# Patient Record
Sex: Male | Born: 1935 | Race: Black or African American | Hispanic: No | Marital: Married | Smoking: Former smoker
Health system: Southern US, Community
[De-identification: ages and names within clinical notes are randomized; demographics above are authoritative.]

## PROBLEM LIST (undated history)

## (undated) DIAGNOSIS — G4730 Sleep apnea, unspecified: Secondary | ICD-10-CM

## (undated) DIAGNOSIS — J45909 Unspecified asthma, uncomplicated: Secondary | ICD-10-CM

## (undated) DIAGNOSIS — D649 Anemia, unspecified: Secondary | ICD-10-CM

## (undated) DIAGNOSIS — M1990 Unspecified osteoarthritis, unspecified site: Secondary | ICD-10-CM

## (undated) DIAGNOSIS — I272 Pulmonary hypertension, unspecified: Secondary | ICD-10-CM

## (undated) DIAGNOSIS — E119 Type 2 diabetes mellitus without complications: Secondary | ICD-10-CM

## (undated) DIAGNOSIS — K219 Gastro-esophageal reflux disease without esophagitis: Secondary | ICD-10-CM

## (undated) DIAGNOSIS — E7800 Pure hypercholesterolemia, unspecified: Secondary | ICD-10-CM

## (undated) DIAGNOSIS — IMO0001 Reserved for inherently not codable concepts without codable children: Secondary | ICD-10-CM

## (undated) DIAGNOSIS — R519 Headache, unspecified: Secondary | ICD-10-CM

## (undated) DIAGNOSIS — I10 Essential (primary) hypertension: Secondary | ICD-10-CM

## (undated) DIAGNOSIS — R51 Headache: Secondary | ICD-10-CM

## (undated) DIAGNOSIS — K5900 Constipation, unspecified: Secondary | ICD-10-CM

## (undated) DIAGNOSIS — N189 Chronic kidney disease, unspecified: Secondary | ICD-10-CM

## (undated) DIAGNOSIS — K759 Inflammatory liver disease, unspecified: Secondary | ICD-10-CM

## (undated) HISTORY — PX: JOINT REPLACEMENT: SHX530

## (undated) HISTORY — PX: PENILE PROSTHESIS IMPLANT: SHX240

## (undated) HISTORY — PX: COLONOSCOPY: SHX174

## (undated) HISTORY — PX: AV FISTULA PLACEMENT: SHX1204

---

## 1997-07-01 ENCOUNTER — Other Ambulatory Visit: Admission: RE | Admit: 1997-07-01 | Discharge: 1997-07-01 | Payer: Self-pay | Admitting: Family Medicine

## 1998-01-12 ENCOUNTER — Encounter: Payer: Self-pay | Admitting: Family Medicine

## 1998-01-12 ENCOUNTER — Ambulatory Visit (HOSPITAL_COMMUNITY): Admission: RE | Admit: 1998-01-12 | Discharge: 1998-01-12 | Payer: Self-pay | Admitting: Family Medicine

## 1998-12-19 ENCOUNTER — Ambulatory Visit (HOSPITAL_COMMUNITY): Admission: RE | Admit: 1998-12-19 | Discharge: 1998-12-19 | Payer: Self-pay | Admitting: Family Medicine

## 1998-12-19 ENCOUNTER — Encounter: Payer: Self-pay | Admitting: Family Medicine

## 2000-07-05 ENCOUNTER — Ambulatory Visit (HOSPITAL_COMMUNITY): Admission: RE | Admit: 2000-07-05 | Discharge: 2000-07-05 | Payer: Self-pay | Admitting: Family Medicine

## 2000-07-05 ENCOUNTER — Encounter: Payer: Self-pay | Admitting: Family Medicine

## 2000-09-17 ENCOUNTER — Encounter: Admission: RE | Admit: 2000-09-17 | Discharge: 2000-09-17 | Payer: Self-pay | Admitting: *Deleted

## 2000-09-17 ENCOUNTER — Encounter: Payer: Self-pay | Admitting: Allergy and Immunology

## 2001-02-02 ENCOUNTER — Encounter: Payer: Self-pay | Admitting: *Deleted

## 2001-02-02 ENCOUNTER — Emergency Department (HOSPITAL_COMMUNITY): Admission: EM | Admit: 2001-02-02 | Discharge: 2001-02-02 | Payer: Self-pay | Admitting: *Deleted

## 2001-11-05 ENCOUNTER — Encounter: Payer: Self-pay | Admitting: Urology

## 2001-11-05 ENCOUNTER — Encounter: Admission: RE | Admit: 2001-11-05 | Discharge: 2001-11-05 | Payer: Self-pay | Admitting: Urology

## 2002-12-11 ENCOUNTER — Ambulatory Visit (HOSPITAL_COMMUNITY): Admission: RE | Admit: 2002-12-11 | Discharge: 2002-12-11 | Payer: Self-pay | Admitting: Family Medicine

## 2002-12-11 ENCOUNTER — Encounter: Payer: Self-pay | Admitting: Family Medicine

## 2002-12-20 ENCOUNTER — Emergency Department (HOSPITAL_COMMUNITY): Admission: EM | Admit: 2002-12-20 | Discharge: 2002-12-20 | Payer: Self-pay | Admitting: Emergency Medicine

## 2003-04-19 ENCOUNTER — Emergency Department (HOSPITAL_COMMUNITY): Admission: EM | Admit: 2003-04-19 | Discharge: 2003-04-19 | Payer: Self-pay | Admitting: Emergency Medicine

## 2005-04-25 ENCOUNTER — Ambulatory Visit: Payer: Self-pay | Admitting: Physical Medicine & Rehabilitation

## 2005-04-25 ENCOUNTER — Inpatient Hospital Stay (HOSPITAL_COMMUNITY): Admission: AD | Admit: 2005-04-25 | Discharge: 2005-04-28 | Payer: Self-pay | Admitting: Orthopaedic Surgery

## 2005-09-15 ENCOUNTER — Emergency Department (HOSPITAL_COMMUNITY): Admission: EM | Admit: 2005-09-15 | Discharge: 2005-09-15 | Payer: Self-pay | Admitting: Emergency Medicine

## 2014-08-06 ENCOUNTER — Other Ambulatory Visit (HOSPITAL_COMMUNITY): Payer: Self-pay | Admitting: Gastroenterology

## 2014-08-06 DIAGNOSIS — B182 Chronic viral hepatitis C: Secondary | ICD-10-CM

## 2014-08-19 ENCOUNTER — Ambulatory Visit (HOSPITAL_COMMUNITY): Payer: Self-pay

## 2014-08-27 ENCOUNTER — Ambulatory Visit (HOSPITAL_COMMUNITY)
Admission: RE | Admit: 2014-08-27 | Discharge: 2014-08-27 | Disposition: A | Discharge status: Home / Self Care | Payer: Medicare Other | Source: Ambulatory Visit | Attending: Gastroenterology | Admitting: Gastroenterology

## 2014-08-27 DIAGNOSIS — B182 Chronic viral hepatitis C: Secondary | ICD-10-CM

## 2014-08-27 DIAGNOSIS — N281 Cyst of kidney, acquired: Secondary | ICD-10-CM | POA: Insufficient documentation

## 2014-08-27 DIAGNOSIS — I77811 Abdominal aortic ectasia: Secondary | ICD-10-CM | POA: Diagnosis not present

## 2015-03-12 ENCOUNTER — Other Ambulatory Visit: Payer: Self-pay | Admitting: Ophthalmology

## 2015-03-15 NOTE — H&P (Signed)
History & Physical:   DATE:   03-11-15 NAME:  Anthony Davis, Anthony Davis      [id number]       HISTORY OF PRESENT ILLNESS: Dialysis M/W/F/Sat     Chief Eye Complaints   Patient c/o having a cyst on LLL for about six months now. Patient c/o having problems with vision due to cataracts. Not using any gtts at this time.   HPI: EYES: Reports symptoms of problems driving & seeing at night.     LOCATION: left eye     QUALITY/COURSE:   Reports condition is worsening    INTENSITY/SEVERITY:    Reports measurement ( or degree) as severe   DURATION:   Reports the general length of symptoms to be months            ACTIVE PROBLEMS: Spastic entropion of right lower eyelid   ICD10: H02.042  ICD9:    Cysts of left lower eyelid   ICD10: H02.825  ICD9:    Age-related nuclear cataract, bilateral   ICD10: H25.13  ICD9:    Presbyopia    ICD10: H52.4  ICD9: 367.0  Onset: 03/11/2015 10:18  SURGERIES: Fistula left arm   MEDICATIONS: Multivitamin: Strength-  SIG-  Dose-  Freq-    Aspirin:  81 mg tablet  SIG-  1 each   once a day   Ciloxan (Ciprofloxacin) Solution: 0.3% solution SIG-  2 drop(s)  every 4 hours    Ilevro: 0.3% suspension SIG-  1 gtt in right eye once a day  REVIEW OF SYSTEMS: ROS:   GEN- Constitutional: HENT: GEN - Endocrine: Reports symptoms of LUNGS/Respiratory:  HEART/Cardiovascular: Reports symptoms of ABD/Gastrointestinal:  Musculoskeletal (BJE): NEURO/Neurological: PSYCH/Psychiatric:    Is the pt oriented to time, place, person? yes  Mood  normal    TOBACCO: Smoker Status:   Former Smoker. ICD10: Z87.891 ICD9: V15.82 Onset: 03/11/2015 10:22  SOCIAL HISTORY:noncontributiry  FAMILY HISTORY: Family History - 1st Degree Relatives:  Son alive and well.  ALLERGIES: Nonsteroidal Anti-Inflammatory Agents Severity-  Onset-  Reaction-  Status- Active Type- Drug allergy Date Changed-   TETRACYCLINE:  Sulfa Topicals Severity-  Onset-  Reaction-  Status- Active Type- Drug  allergy Date Changed-   Starter - Allergies - Summary:  PHYSICAL EXAMINATION: Va   03/11/2015 10:22   OD  cc 20/40 ph 20/30 OS  cc 20/100 ph 20/70  EYEGLASSES:  OD  -0.25 +0.50 x055            OS -1.25 +1.50 x116 ADD:  MR  03/11/2015 10:30  OD -0.25 +0.50 x055 20/40  OS -1.25 +1.50 x116 20/100 ADD +2.75  K's OD:45.25   axis 107     46.00   axis 017 OS 45.00  axis 029   46.75   axis 119  VF:   OD full in all four quadrants OS full in all four quadrants  Motility  full   PUPILS: 56mm reactive -mg   EYELIDS & OCULAR ADNEXA LLL cysts in nasal region with inclusion, skin tage temporal on LLL, RLL has spastic entropion,    SLE: Conjunctiva  Cornea   Decreased Tear Break-Up Time and arcus OU    anterior chamber  :  deep and quiet    Iris brown   Lens +2  nuclear  sclerosis  OD , 2+  nuclear  sclerosis  , +3 psc OS   Vitreous  CCT  Ta   in mmHg    OD  17          OS 17 Time  03/11/2015 10:32   Gonio   Dilation  phenyl 2.5% and trop 1% 03/11/2015 10:32   Fundus: difficult view   optic nerve  OD55% PINK CUP                                                                       OS 65-70% PINK CUP    Macula      OD dull light reflex                                                    OS dull light reflex   Vessels narrow arterioles   Periphery difficult view      Exam: GENERAL: Appearance: HEAD, EARS, NOSE AND THROAT: Ears-Nose (external) Inspection: Externally, nose and ears are normal in appearance and without scars, lesions, or nodules.      Hearing assessment shows no problems with normal conversation.      LUNGS and RESPIRATORY: Lung auscultation elicits no wheezing, rhonci, rales or rubs and with equal breath sounds.    Respiratory effort described as breathing is unlabored and chest movement is symmetrical.    HEART (Cardiovascular): Heart auscultation discovers regular rate and rhythm; no murmur, gallop or rub. Normal heart sounds.    ABDOMEN  (Gastrointestinal): Mass/Tenderness Exam: Neither are present.     MUSCULOSKELETAL (BJE): Inspection-Palpation: No major bone, joint, tendon, or muscle changes.      NEUROLOGICAL: Alert and oriented. No major deficits of coordination or sensation.      PSYCHIATRIC: Insight and judgment appear  both to be intact and appropriate.    Mood and affect are described as normal mood and full affect.    SKIN: Skin Inspection: No rashes or lesions  ADMITTING DIAGNOSIS: Age-related nuclear cataract, bilateral   ICD10: H25.813  ICD9:  Spastic entropion of right lower eyelid   ICD10: H02.042  ICD9:    Cysts of left lower eyelid   ICD10: H02.825  ICD9:      Presbyopia    ICD10: H52.4  ICD9: 367.0  Onset: 03/11/2015 10:18  SURGICAL TREATMENT PLAN: phaco emulsion cataract extraction  with  intraocular lens implant  OS  Risk and benefits of surgery have been reviewed with the patient and the patient agrees to proceed with the surgical procedure.  .    ___________________________ Marylynn Pearson, Brooke Bonito Starter - Inactive Problems:

## 2015-03-16 ENCOUNTER — Encounter (HOSPITAL_COMMUNITY): Payer: Self-pay | Admitting: *Deleted

## 2015-03-16 NOTE — Progress Notes (Addendum)
Pt denies cardiac history. States he does have pulmonary hypertension and is followed by his nephrologist. He is on dialysis, usually M/W/F, but now is doing 5 days a week since he was having fluid buildup. He states he's feeling much better since going to dialysis more often. Pt states he's been told he was diabetic, but is not on any medications for it.  Pre-op instructions given to pt and his wife Anthony Davis.  Pt will bring medication list tomorrow, he states it's from his PCP. That list is in Care Everywhere and that's where I got his medications from when instructing him what to take in AM.

## 2015-03-16 NOTE — Progress Notes (Signed)
Anesthesia Chart Review: SAME DAY WORK-UP.  Patient is a 80 year old male scheduled for left cataract extraction phaco and intraocular lens placement on 03/17/15 by Dr. Venetia Maxon. Anesthesia is posted for MAC.  Currently, PAT phone RN has not yet been able to reach patient to complete history.  History (as obtained by Care Everywhere records from [facility]) includes HTN, ESRD on HD (LUE AVF), DM2 (A1C 01/14/15 5.4), OSA without CPAP use, viral hepatitis C '95 s/p Zepatier X 12 weeks (started 10/12/14), left TKA '06, s/p right knee excision of benign lesion 10/27/14 ([place] with LMA at [facility]), schizophrenia, perone's disease s/p penile prosthesis and urethral stricture s/p dilation '14. Hospitalization for diverticulosis without diverticulitis 01/18/15-01/26/15. Seen in ED 03/02/15 for penile edema, abdominal pain, SOB and chest heaviness (without chest pain) X one month. EKG and troponin WNL. ED MD discussed with nephrologist Dr. Willene Hatchet who though patient likely had fluid issue and to follow-up at hemodialysis. Continued follow-up with PCP and GI also recommended. Patient was seen by urology yesterday.   - PCP is Dr. Fortunato Curling with [facility]. - Urologist is Dr. Estill Dooms with [facility].  - Hepatologist is Dr. Marty Heck with [facility]. His plan as of 01/28/15: PLAN: 1. Hepatitis A and B immune. 2. PCV and flu shots by his local physicians. 3. No indication for EGD with his platelets over 100.  4. No indication of cirrhosis, so [place] surveillance not needed. 5. Follow up in 8 weeks for [facility] visit. - Evaluated at [facility] Renal transplant clinic (initial phases)   . - Nephrologist is Dr. Willene Hatchet.  Medications listed in Care Everywhere as of 03/15/15 included albuterol, amlodipine, ASA, Symbicort, Tums, Nexium, Flovent, lactulose, lovastatin, Lopressor, Seroquel XR, Risperdal, Sensipar, clonidine-TTS, doxazosin, losartan.   03/02/15 EKG Result  Narrative ONLY (Care Everywhere; [facility]):  Normal sinus rhythm with sinus arrhythmia Normal ECG When compared with ECG of 18-Jan-2015 17:32, Premature atrial complexes are no longer Present Nonspecific T wave abnormality now evident in Anterior leads Confirmed by Aundria Rud [phone]) on 03/02/2015 6:34:08 PM  12/17/14 Echo (Care Everywhere, [facility]): Summary  Grossly normal LV function.  Mild concentric left ventricular hypertrophy  Ejection fraction is visually estimated at 50-55%  Mild mitral and tricuspid regurgitaton  Mild to moderately dilated and hypokinetic right ventricle.  Right ventricular septum flattened in systole and diastole consistent with RV  pressure and volume overload.  Severe Pulmonary hypertension, RVSP 75 mmHg   03/02/15 CXR (Care Everywhere, [facility]): IMPRESSION: 1. No acute cardiopulmonary disease. 2. Stable moderate cardiomegaly without pulmonary edema. 3. Stable chronic elevation the right hemidiaphragm with chronic scar/atelectasis in the right lower lobe. 4. Stable small chronic bilateral pleural effusions.  He is for labs on arrival.  Discussed available information with anesthesiologist Dr. Conrad Hardee. Patient had severe pulmonary HTN by 12/2014 echo. Case is posted for MAC. He is a hemodialysis patient. Further evaluation of patient and review of labs by his anesthesiologist on the day of surgery. Definitive anesthesia plan to be determined on the day of surgery.   George Hugh [facility] Short Stay Center/Anesthesiology Phone [phone] 03/16/2015 4:22 PM

## 2015-03-17 ENCOUNTER — Ambulatory Visit (HOSPITAL_COMMUNITY)
Admission: RE | Admit: 2015-03-17 | Discharge: 2015-03-17 | Disposition: A | Discharge status: Home / Self Care | Payer: Medicare Other | Source: Ambulatory Visit | Attending: Ophthalmology | Admitting: Ophthalmology

## 2015-03-17 ENCOUNTER — Other Ambulatory Visit: Payer: Self-pay

## 2015-03-17 ENCOUNTER — Ambulatory Visit (HOSPITAL_COMMUNITY): Payer: Medicare Other | Admitting: Vascular Surgery

## 2015-03-17 ENCOUNTER — Encounter (HOSPITAL_COMMUNITY)
Admission: RE | Disposition: A | Discharge status: Home / Self Care | Payer: Self-pay | Source: Ambulatory Visit | Attending: Ophthalmology

## 2015-03-17 ENCOUNTER — Encounter (HOSPITAL_COMMUNITY): Payer: Self-pay | Admitting: *Deleted

## 2015-03-17 DIAGNOSIS — R0681 Apnea, not elsewhere classified: Secondary | ICD-10-CM | POA: Insufficient documentation

## 2015-03-17 DIAGNOSIS — M1990 Unspecified osteoarthritis, unspecified site: Secondary | ICD-10-CM | POA: Insufficient documentation

## 2015-03-17 DIAGNOSIS — Z538 Procedure and treatment not carried out for other reasons: Secondary | ICD-10-CM | POA: Insufficient documentation

## 2015-03-17 DIAGNOSIS — H524 Presbyopia: Secondary | ICD-10-CM | POA: Diagnosis not present

## 2015-03-17 DIAGNOSIS — H2512 Age-related nuclear cataract, left eye: Secondary | ICD-10-CM | POA: Diagnosis present

## 2015-03-17 DIAGNOSIS — Z96652 Presence of left artificial knee joint: Secondary | ICD-10-CM | POA: Diagnosis not present

## 2015-03-17 DIAGNOSIS — H2513 Age-related nuclear cataract, bilateral: Secondary | ICD-10-CM | POA: Diagnosis not present

## 2015-03-17 DIAGNOSIS — H02825 Cysts of left lower eyelid: Secondary | ICD-10-CM | POA: Diagnosis not present

## 2015-03-17 DIAGNOSIS — N186 End stage renal disease: Secondary | ICD-10-CM | POA: Diagnosis not present

## 2015-03-17 DIAGNOSIS — I272 Other secondary pulmonary hypertension: Secondary | ICD-10-CM | POA: Diagnosis not present

## 2015-03-17 DIAGNOSIS — Z992 Dependence on renal dialysis: Secondary | ICD-10-CM | POA: Diagnosis not present

## 2015-03-17 DIAGNOSIS — Z87891 Personal history of nicotine dependence: Secondary | ICD-10-CM | POA: Insufficient documentation

## 2015-03-17 DIAGNOSIS — J90 Pleural effusion, not elsewhere classified: Secondary | ICD-10-CM | POA: Diagnosis not present

## 2015-03-17 DIAGNOSIS — I120 Hypertensive chronic kidney disease with stage 5 chronic kidney disease or end stage renal disease: Secondary | ICD-10-CM | POA: Insufficient documentation

## 2015-03-17 DIAGNOSIS — Z7982 Long term (current) use of aspirin: Secondary | ICD-10-CM | POA: Diagnosis not present

## 2015-03-17 HISTORY — PX: CATARACT EXTRACTION W/PHACO: SHX586

## 2015-03-17 HISTORY — DX: Anemia, unspecified: D64.9

## 2015-03-17 HISTORY — DX: Chronic kidney disease, unspecified: N18.9

## 2015-03-17 HISTORY — DX: Headache: R51

## 2015-03-17 HISTORY — DX: Pure hypercholesterolemia, unspecified: E78.00

## 2015-03-17 HISTORY — DX: Gastro-esophageal reflux disease without esophagitis: K21.9

## 2015-03-17 HISTORY — DX: Headache, unspecified: R51.9

## 2015-03-17 HISTORY — DX: Unspecified asthma, uncomplicated: J45.909

## 2015-03-17 HISTORY — DX: Pulmonary hypertension, unspecified: I27.20

## 2015-03-17 HISTORY — DX: Sleep apnea, unspecified: G47.30

## 2015-03-17 HISTORY — DX: Reserved for inherently not codable concepts without codable children: IMO0001

## 2015-03-17 HISTORY — DX: Constipation, unspecified: K59.00

## 2015-03-17 HISTORY — DX: Unspecified osteoarthritis, unspecified site: M19.90

## 2015-03-17 HISTORY — DX: Inflammatory liver disease, unspecified: K75.9

## 2015-03-17 HISTORY — DX: Type 2 diabetes mellitus without complications: E11.9

## 2015-03-17 HISTORY — DX: Essential (primary) hypertension: I10

## 2015-03-17 LAB — POCT I-STAT 4, (NA,K, GLUC, HGB,HCT)
Glucose, Bld: 100 mg/dL — ABNORMAL HIGH (ref 65–99)
HEMATOCRIT: 35 % — AB (ref 39.0–52.0)
Hemoglobin: 11.9 g/dL — ABNORMAL LOW (ref 13.0–17.0)
POTASSIUM: 3.9 mmol/L (ref 3.5–5.1)
SODIUM: 136 mmol/L (ref 135–145)

## 2015-03-17 LAB — GLUCOSE, CAPILLARY: GLUCOSE-CAPILLARY: 135 mg/dL — AB (ref 65–99)

## 2015-03-17 SURGERY — PHACOEMULSIFICATION, CATARACT, WITH IOL INSERTION
Anesthesia: Monitor Anesthesia Care | Laterality: Left

## 2015-03-17 MED ORDER — SODIUM CHLORIDE 0.9 % IV SOLN
10.0000 mg | INTRAVENOUS | Status: DC | PRN
  Administered 2015-03-17: 50 ug/min via INTRAVENOUS

## 2015-03-17 MED ORDER — PHENYLEPHRINE HCL 2.5 % OP SOLN
1.0000 [drp] | OPHTHALMIC | Status: AC
  Administered 2015-03-17 (×3): 1 [drp] via OPHTHALMIC
  Filled 2015-03-17: qty 2

## 2015-03-17 MED ORDER — EPHEDRINE SULFATE 50 MG/ML IJ SOLN
INTRAMUSCULAR | Status: DC | PRN
  Administered 2015-03-17: 12.5 mg via INTRAVENOUS
  Administered 2015-03-17: 25 mg via INTRAVENOUS
  Administered 2015-03-17: 12.5 mg via INTRAVENOUS

## 2015-03-17 MED ORDER — GATIFLOXACIN 0.5 % OP SOLN
1.0000 [drp] | OPHTHALMIC | Status: AC
  Administered 2015-03-17 (×3): 1 [drp] via OPHTHALMIC
  Filled 2015-03-17: qty 2.5

## 2015-03-17 MED ORDER — FENTANYL CITRATE (PF) 250 MCG/5ML IJ SOLN
INTRAMUSCULAR | Status: DC | PRN
  Administered 2015-03-17: 50 ug via INTRAVENOUS

## 2015-03-17 MED ORDER — FENTANYL CITRATE (PF) 100 MCG/2ML IJ SOLN: 25.0000 ug | INTRAMUSCULAR | Status: DC | PRN

## 2015-03-17 MED ORDER — NALOXONE HCL 0.4 MG/ML IJ SOLN
INTRAMUSCULAR | Status: DC | PRN
  Administered 2015-03-17: 200 ug via INTRAVENOUS

## 2015-03-17 MED ORDER — PROPOFOL 10 MG/ML IV BOLUS
INTRAVENOUS | Status: AC
  Filled 2015-03-17: qty 20

## 2015-03-17 MED ORDER — GLYCOPYRROLATE 0.2 MG/ML IJ SOLN
INTRAMUSCULAR | Status: AC
  Filled 2015-03-17: qty 1

## 2015-03-17 MED ORDER — VASOPRESSIN 20 UNIT/ML IV SOLN
INTRAVENOUS | Status: DC | PRN
  Administered 2015-03-17 (×2): 2 [IU] via INTRAVENOUS

## 2015-03-17 MED ORDER — MIDAZOLAM HCL 2 MG/2ML IJ SOLN
INTRAMUSCULAR | Status: AC
  Filled 2015-03-17: qty 2

## 2015-03-17 MED ORDER — FENTANYL CITRATE (PF) 250 MCG/5ML IJ SOLN
INTRAMUSCULAR | Status: AC
  Filled 2015-03-17: qty 5

## 2015-03-17 MED ORDER — TETRACAINE HCL 0.5 % OP SOLN
1.0000 [drp] | OPHTHALMIC | Status: AC
  Administered 2015-03-17: 1 [drp] via OPHTHALMIC
  Filled 2015-03-17 (×2): qty 2

## 2015-03-17 MED ORDER — PHENYLEPHRINE HCL 10 MG/ML IJ SOLN
INTRAMUSCULAR | Status: AC
  Filled 2015-03-17: qty 3

## 2015-03-17 MED ORDER — TROPICAMIDE 1 % OP SOLN
1.0000 [drp] | OPHTHALMIC | Status: AC
  Administered 2015-03-17 (×3): 1 [drp] via OPHTHALMIC
  Filled 2015-03-17: qty 3

## 2015-03-17 MED ORDER — HYALURONIDASE HUMAN 150 UNIT/ML IJ SOLN
INTRAMUSCULAR | Status: AC
  Filled 2015-03-17: qty 1

## 2015-03-17 MED ORDER — MIDAZOLAM HCL 5 MG/5ML IJ SOLN
INTRAMUSCULAR | Status: DC | PRN
  Administered 2015-03-17: 1 mg via INTRAVENOUS

## 2015-03-17 MED ORDER — CYCLOPENTOLATE HCL 1 % OP SOLN
1.0000 [drp] | OPHTHALMIC | Status: AC
  Administered 2015-03-17 (×3): 1 [drp] via OPHTHALMIC
  Filled 2015-03-17: qty 2

## 2015-03-17 MED ORDER — PHENYLEPHRINE HCL 10 MG/ML IJ SOLN
INTRAMUSCULAR | Status: DC | PRN
  Administered 2015-03-17: 160 ug via INTRAVENOUS
  Administered 2015-03-17 (×2): 80 ug via INTRAVENOUS
  Administered 2015-03-17: 160 ug via INTRAVENOUS
  Administered 2015-03-17 (×2): 80 ug via INTRAVENOUS
  Administered 2015-03-17: 120 ug via INTRAVENOUS

## 2015-03-17 MED ORDER — PROPOFOL 10 MG/ML IV BOLUS
INTRAVENOUS | Status: DC | PRN
  Administered 2015-03-17: 40 mg via INTRAVENOUS
  Administered 2015-03-17: 10 mg via INTRAVENOUS

## 2015-03-17 MED ORDER — ONDANSETRON HCL 4 MG/2ML IJ SOLN: 4.0000 mg | Freq: Once | INTRAMUSCULAR | Status: DC | PRN

## 2015-03-17 MED ORDER — GLYCOPYRROLATE 0.2 MG/ML IJ SOLN
INTRAMUSCULAR | Status: DC | PRN
  Administered 2015-03-17: 0.2 mg via INTRAVENOUS

## 2015-03-17 MED ORDER — NOREPINEPHRINE BITARTRATE 1 MG/ML IV SOLN
0.0000 ug/min | INTRAVENOUS | Status: DC
  Filled 2015-03-17: qty 4

## 2015-03-17 MED ORDER — SODIUM CHLORIDE 0.9 % IV SOLN
INTRAVENOUS | Status: DC
  Administered 2015-03-17 (×2): via INTRAVENOUS

## 2015-03-17 SURGICAL SUPPLY — 33 items
APL SRG 3 HI ABS STRL LF PLS (MISCELLANEOUS) ×1
APPLICATOR COTTON TIP 6IN STRL (MISCELLANEOUS) ×3 IMPLANT
APPLICATOR DR MATTHEWS STRL (MISCELLANEOUS) ×3 IMPLANT
BLADE KERATOME 2.75 (BLADE) ×2 IMPLANT
BLADE KERATOME 2.75MM (BLADE) ×1
CANNULA ANTERIOR CHAMBER 27GA (MISCELLANEOUS) ×3 IMPLANT
CORDS BIPOLAR (ELECTRODE) IMPLANT
COVER MAYO STAND STRL (DRAPES) ×3 IMPLANT
DRAPE OPHTHALMIC 40X48 W POUCH (DRAPES) ×3 IMPLANT
DRAPE RETRACTOR (MISCELLANEOUS) ×3 IMPLANT
GLOVE BIO SURGEON STRL SZ8 (GLOVE) ×3 IMPLANT
GOWN STRL REUS W/ TWL LRG LVL3 (GOWN DISPOSABLE) ×2 IMPLANT
GOWN STRL REUS W/TWL LRG LVL3 (GOWN DISPOSABLE) ×6
KIT BASIN OR (CUSTOM PROCEDURE TRAY) ×3 IMPLANT
KIT ROOM TURNOVER OR (KITS) ×3 IMPLANT
NDL 18GX1X1/2 (RX/OR ONLY) (NEEDLE) ×1 IMPLANT
NDL 25GX 5/8IN NON SAFETY (NEEDLE) ×1 IMPLANT
NDL FILTER BLUNT 18X1 1/2 (NEEDLE) ×1 IMPLANT
NEEDLE 18GX1X1/2 (RX/OR ONLY) (NEEDLE) ×3 IMPLANT
NEEDLE 25GX 5/8IN NON SAFETY (NEEDLE) ×3 IMPLANT
NEEDLE FILTER BLUNT 18X 1/2SAF (NEEDLE) ×2
NEEDLE FILTER BLUNT 18X1 1/2 (NEEDLE) ×1 IMPLANT
NS IRRIG 1000ML POUR BTL (IV SOLUTION) ×3 IMPLANT
PACK CATARACT CUSTOM (CUSTOM PROCEDURE TRAY) ×3 IMPLANT
PAD ARMBOARD 7.5X6 YLW CONV (MISCELLANEOUS) ×3 IMPLANT
PAK PIK CVS CATARACT (OPHTHALMIC) ×3 IMPLANT
SUT ETHILON 10 0 CS140 6 (SUTURE) IMPLANT
SUT SILK 6 0 G 6 (SUTURE) IMPLANT
SYR TB 1ML LUER SLIP (SYRINGE) ×3 IMPLANT
TIP ABS 45DEG FLARED 0.9MM (TIP) ×3 IMPLANT
TOWEL OR 17X26 10 PK STRL BLUE (TOWEL DISPOSABLE) ×3 IMPLANT
WATER STERILE IRR 1000ML POUR (IV SOLUTION) ×3 IMPLANT
WIPE INSTRUMENT VISIWIPE 73X73 (MISCELLANEOUS) ×3 IMPLANT

## 2015-03-17 NOTE — Anesthesia Postprocedure Evaluation (Signed)
Anesthesia Post Note  Patient: Anthony Davis  Procedure(s) Performed: Procedure(s) (LRB): Cancelled procedure (Left)  Patient location during evaluation: PACU Anesthesia Type: MAC Level of consciousness: awake and alert and oriented Pain management: pain level controlled Vital Signs Assessment: post-procedure vital signs reviewed and stable Respiratory status: spontaneous breathing, nonlabored ventilation and respiratory function stable Cardiovascular status: stable and blood pressure returned to baseline Anesthetic complications: yes Anesthetic complication details: anesthesia complicationsComments: Anthony Davis had significant hypotension and apnea intraop immediately after minimal doses of fentanyl and propofol, our typical doses of sedation and analgesia for patient comfort during a peribulbar block for cataract surgery. His apnea was treated with bag mask ventilation and then intubation to ensure adequate oxygenation and ventilation. The fentanyl was reversed with naloxone, a total of 2100mcg given over 3 minutes with doses of 41mcg at a time. His hypotension was treated with fluid bolus, phenylephrine, and vasopressin bolus's of 1-2 units x3 times. He did respond after ten minutes. He had bilateral, clear breath sounds immediately after intubation and no cutaneous manifestations to suggest anaphylaxis. The working diagnosis being a high spinal from the peribulbar block (unlikely given the anatomy of the block and the rapidity with which he showed clinical improvement, ~ 15 minutes) vs. Sympathetic drop from analgesia and sedation medication which lead to a drop in preload of his right heart in the setting of pulmonary hypertension which is likely more significant than was realized. He never had EKG changes during the event staying sinus rhythm from 70-80 beats/min, SaO2 remained >90 throughout. He was stable for extubation, following commands with normalized hemodynamics. CPR was not needed as he  maintained a carotid pulse throughout the procedure.  EKG in pacu is sinus rhythm with no significant findings, hemodynamics totally stable, following all commands and he is alert and oriented and completely baseline. Ultimately in review the likely diagnosis was decreased preload from the sympathectomy following fentanyl and propofol and a period of apnea that led to increased pulmonary pressures and decrease in left heart stroke volume and systemic hypotension. Anthony Davis clearly has severely limited cardiopulmonary reserve due to his systemic diseases of pulmonary hypertension and end stage renal disease requiring dialysis.     Last Vitals:  Filed Vitals:   03/17/15 1340 03/17/15 1353  BP: 142/68 132/65  Pulse: 57 56  Temp:    Resp: 16 13    Last Pain: There were no vitals filed for this visit.                 Zenaida Deed

## 2015-03-17 NOTE — Anesthesia Procedure Notes (Signed)
Procedure Name: Intubation Date/Time: 03/17/2015 12:37 PM Performed by: Scheryl Darter Pre-anesthesia Checklist: Patient identified, Emergency Drugs available, Suction available, Patient being monitored and Timeout performed Oxygen Delivery Method: Circle system utilized Preoxygenation: Pre-oxygenation with 100% oxygen Ventilation: Mask ventilation without difficulty Laryngoscope Size: Mac and 4 Grade View: Grade I Tube type: Oral Tube size: 7.5 mm Number of attempts: 1 Airway Equipment and Method: Stylet Placement Confirmation: ETT inserted through vocal cords under direct vision,  positive ETCO2 and breath sounds checked- equal and bilateral Secured at: 23 cm Tube secured with: Tape Dental Injury: Teeth and Oropharynx as per pre-operative assessment  Comments: Intubated without medication for relaxation

## 2015-03-17 NOTE — H&P (View-Only) (Signed)
History & Physical:   DATE:   03-11-15 NAME:  Anthony Davis, Anthony Davis      [id number]       HISTORY OF PRESENT ILLNESS: Dialysis M/W/F/Sat     Chief Eye Complaints   Patient c/o having a cyst on LLL for about six months now. Patient c/o having problems with vision due to cataracts. Not using any gtts at this time.   HPI: EYES: Reports symptoms of problems driving & seeing at night.     LOCATION: left eye     QUALITY/COURSE:   Reports condition is worsening    INTENSITY/SEVERITY:    Reports measurement ( or degree) as severe   DURATION:   Reports the general length of symptoms to be months            ACTIVE PROBLEMS: Spastic entropion of right lower eyelid   ICD10: H02.042  ICD9:    Cysts of left lower eyelid   ICD10: H02.825  ICD9:    Age-related nuclear cataract, bilateral   ICD10: H25.13  ICD9:    Presbyopia    ICD10: H52.4  ICD9: 367.0  Onset: 03/11/2015 10:18  SURGERIES: Fistula left arm   MEDICATIONS: Multivitamin: Strength-  SIG-  Dose-  Freq-    Aspirin:  81 mg tablet  SIG-  1 each   once a day   Ciloxan (Ciprofloxacin) Solution: 0.3% solution SIG-  2 drop(s)  every 4 hours    Ilevro: 0.3% suspension SIG-  1 gtt in right eye once a day  REVIEW OF SYSTEMS: ROS:   GEN- Constitutional: HENT: GEN - Endocrine: Reports symptoms of LUNGS/Respiratory:  HEART/Cardiovascular: Reports symptoms of ABD/Gastrointestinal:  Musculoskeletal (BJE): NEURO/Neurological: PSYCH/Psychiatric:    Is the pt oriented to time, place, person? yes  Mood  normal    TOBACCO: Smoker Status:   Former Smoker. ICD10: Z87.891 ICD9: V15.82 Onset: 03/11/2015 10:22  SOCIAL HISTORY:noncontributiry  FAMILY HISTORY: Family History - 1st Degree Relatives:  Son alive and well.  ALLERGIES: Nonsteroidal Anti-Inflammatory Agents Severity-  Onset-  Reaction-  Status- Active Type- Drug allergy Date Changed-   TETRACYCLINE:  Sulfa Topicals Severity-  Onset-  Reaction-  Status- Active Type- Drug  allergy Date Changed-   Starter - Allergies - Summary:  PHYSICAL EXAMINATION: Va   03/11/2015 10:22   OD  cc 20/40 ph 20/30 OS  cc 20/100 ph 20/70  EYEGLASSES:  OD  -0.25 +0.50 x055            OS -1.25 +1.50 x116 ADD:  MR  03/11/2015 10:30  OD -0.25 +0.50 x055 20/40  OS -1.25 +1.50 x116 20/100 ADD +2.75  K's OD:45.25   axis 107     46.00   axis 017 OS 45.00  axis 029   46.75   axis 119  VF:   OD full in all four quadrants OS full in all four quadrants  Motility  full   PUPILS: 64mm reactive -mg   EYELIDS & OCULAR ADNEXA LLL cysts in nasal region with inclusion, skin tage temporal on LLL, RLL has spastic entropion,    SLE: Conjunctiva  Cornea   Decreased Tear Break-Up Time and arcus OU    anterior chamber  :  deep and quiet    Iris brown   Lens +2  nuclear  sclerosis  OD , 2+  nuclear  sclerosis  , +3 psc OS   Vitreous  CCT  Ta   in mmHg    OD  17          OS 17 Time  03/11/2015 10:32   Gonio   Dilation  phenyl 2.5% and trop 1% 03/11/2015 10:32   Fundus: difficult view   optic nerve  OD55% PINK CUP                                                                       OS 65-70% PINK CUP    Macula      OD dull light reflex                                                    OS dull light reflex   Vessels narrow arterioles   Periphery difficult view      Exam: GENERAL: Appearance: HEAD, EARS, NOSE AND THROAT: Ears-Nose (external) Inspection: Externally, nose and ears are normal in appearance and without scars, lesions, or nodules.      Hearing assessment shows no problems with normal conversation.      LUNGS and RESPIRATORY: Lung auscultation elicits no wheezing, rhonci, rales or rubs and with equal breath sounds.    Respiratory effort described as breathing is unlabored and chest movement is symmetrical.    HEART (Cardiovascular): Heart auscultation discovers regular rate and rhythm; no murmur, gallop or rub. Normal heart sounds.    ABDOMEN  (Gastrointestinal): Mass/Tenderness Exam: Neither are present.     MUSCULOSKELETAL (BJE): Inspection-Palpation: No major bone, joint, tendon, or muscle changes.      NEUROLOGICAL: Alert and oriented. No major deficits of coordination or sensation.      PSYCHIATRIC: Insight and judgment appear  both to be intact and appropriate.    Mood and affect are described as normal mood and full affect.    SKIN: Skin Inspection: No rashes or lesions  ADMITTING DIAGNOSIS: Age-related nuclear cataract, bilateral   ICD10: H25.813  ICD9:  Spastic entropion of right lower eyelid   ICD10: H02.042  ICD9:    Cysts of left lower eyelid   ICD10: H02.825  ICD9:      Presbyopia    ICD10: H52.4  ICD9: 367.0  Onset: 03/11/2015 10:18  SURGICAL TREATMENT PLAN: phaco emulsion cataract extraction  with  intraocular lens implant  OS  Risk and benefits of surgery have been reviewed with the patient and the patient agrees to proceed with the surgical procedure.  .    ___________________________ Marylynn Pearson, Brooke Bonito Starter - Inactive Problems:

## 2015-03-17 NOTE — Op Note (Signed)
Preoperative diagnosis: Visually significant cataract left eye Postop diagnosis: Same Planned procedure: Phacoemulsification with intraocular lens implant Anesthesia: 2% Xylocaine with epinephrine in a 50-50 mixture 0.75% Marcaine with W[place]ase Procedure: The patient was transported to the operating room where he was given a peribulbar block with the aforementioned local anesthetic agent. The patient became apneic at this point in time and was unresponsive. Therefore he required endotracheal intubation and breathing assistant. After 30 minutes the patient was reaching for the endotracheal tube had adequate oxygenation he remained in sinus rhythm therefore he was extubated. However anesthesia felt due to his cardiovascular and respiratory status at this point that we should not proceed with cataract surgery at this time. The patient was transported to post anesthesia care for further observation. When he left the operating room the patient was breathing on his on and extubated. Marylynn Pearson Junior M.D.

## 2015-03-17 NOTE — Interval H&P Note (Signed)
History and Physical Interval Note:  03/17/2015 11:41 AM  Anthony Davis  has presented today for surgery, with the diagnosis of age related nuclear cataract  The various methods of treatment have been discussed with the patient and family. After consideration of risks, benefits and other options for treatment, the patient has consented to  Procedure(s): CATARACT EXTRACTION PHACO AND INTRAOCULAR LENS PLACEMENT (IOC) LEFT EYE (Left) as a surgical intervention .  The patient's history has been reviewed, patient examined, no change in status, stable for surgery.  I have reviewed the patient's chart and labs.  Questions were answered to the patient's satisfaction.     Hatcher Froning

## 2015-03-17 NOTE — Discharge Instructions (Signed)
Report of OR events today. Mr. Russey presented for left cataract with Dr. Venetia Maxon. He received a standard dose of sedation medication for the peribulbar block performed by Dr. Venetia Maxon which consisted of only 40mcg of fentanyl and a small bolus of propofol. This is our standard dosing for analgesia and sedation for patients to tolerate a peribulbar block. Mr. Deshotel immediately experienced prolonged apnea requiring bag mask support from our CRNA staff and immediately experienced significant hypotension that was unresponsive to usual vasopressor medications and a fluid challenge for over 15 minutes, ultimately needing vasopressin bolus's 1-2 units x 3 times. We quickly secured his airway with a breathing tube atraumatically to ensure oxygen delivery and adequate respirations. I ensured reversal of the fentanyl with naloxone. CPR was not needed as he maintained a carotid pulse throughout this event and his EKG remained sinus at 70-80, his SaO2 from his pulse oximeter remained >90 throughout the event. His hemodynamics stabilized and he followed commands within 15 minutes and the breathing tube was able to be removed. He was observed for over 2 hours in our post operative recovery room and was completely asymptomatic from a cardiac standpoint, followed commands immediately and was hemodynamically stable with an EKG with no significant findings. My best assessment of the situation is that Mr. Pitner has zero cardiopulmonary reserve for IV analgesia/sedation medications that we typically use for sedation at typical doses and that his pulmonary hypertension is indeed clinically significant and he can not tolerate any loss of sympathetic drive. I would have him further evaluated prior to any other procedure needing anesthesia involvement by a cardiologist. Any need for anesthesia will carry an increased risk of hemodynamic compromise given his lack of cardiopulmonary reserve.  Veatrice Kells, MD [facility] Anesthesiology

## 2015-03-17 NOTE — Transfer of Care (Addendum)
Immediate Anesthesia Transfer of Care Note  Patient: Anthony Davis  Procedure(s) Performed: Procedure(s): Cancelled procedure (Left)  Patient Location: PACU  Anesthesia Type:MAC  Level of Consciousness: awake, oriented and sedated  Airway & Oxygen Therapy: Patient Spontanous Breathing and Patient connected to face mask oxygen  Post-op Assessment: Report given to RN, Post -op Vital signs reviewed and stable and Patient moving all extremities  Post vital signs: Reviewed and stable  Last Vitals:  Filed Vitals:   03/17/15 1340 03/17/15 1353  BP: 142/68 132/65  Pulse: 57 56  Temp:    Resp: 16 13    Complications: No apparent anesthesia complications noted in PACU/ vital signs and responses as pre-op/see anesthesiologist note

## 2015-03-17 NOTE — Progress Notes (Signed)
Pt has allergy to Ibuprofen and order for  Acular noted. Dr Venetia Maxon called and informed med d/c as ordered.

## 2015-03-17 NOTE — Anesthesia Preprocedure Evaluation (Addendum)
Anesthesia Evaluation  Patient identified by MRN, date of birth, ID band Patient awake    Reviewed: Allergy & Precautions, H&P , NPO status , Patient's Chart, lab work & pertinent test results  History of Anesthesia Complications Negative for: history of anesthetic complications  Airway Mallampati: II  TM Distance: >3 FB Neck ROM: full    Dental  (+) Edentulous Upper, Edentulous Lower   Pulmonary sleep apnea , former smoker,    Pulmonary exam normal breath sounds clear to auscultation       Cardiovascular hypertension, Pt. on medications Normal cardiovascular exam Rhythm:regular Rate:Normal   12/17/14 Echo (Care Everywhere, [facility]): Summary  Grossly normal LV function.  Mild concentric left ventricular hypertrophy  Ejection fraction is visually estimated at 50-55%  Mild mitral and tricuspid regurgitaton  Mild to moderately dilated and hypokinetic right ventricle.  Right ventricular septum flattened in systole and diastole consistent with RV  pressure and volume overload.  Severe Pulmonary hypertension, RVSP 75 mmHg    Neuro/Psych negative neurological ROS     GI/Hepatic negative GI ROS, (+) Hepatitis -, C  Endo/Other  negative endocrine ROSdiabetes, Well Controlled  Renal/GU DialysisRenal disease     Musculoskeletal  (+) Arthritis ,   Abdominal   Peds  Hematology negative hematology ROS (+)   Anesthesia Other Findings   Reproductive/Obstetrics negative OB ROS                          Anesthesia Physical Anesthesia Plan  ASA: III  Anesthesia Plan: MAC   Post-op Pain Management:    Induction: Intravenous  Airway Management Planned: Nasal Cannula  Additional Equipment:   Intra-op Plan:   Post-operative Plan:   Informed Consent: I have reviewed the patients History and Physical, chart, labs and discussed the procedure including the risks, benefits and  alternatives for the proposed anesthesia with the patient or authorized representative who has indicated his/her understanding and acceptance.   Dental Advisory Given  Plan Discussed with: Anesthesiologist, CRNA and Surgeon  Anesthesia Plan Comments:        Anesthesia Quick Evaluation

## 2015-03-18 ENCOUNTER — Encounter (HOSPITAL_COMMUNITY): Payer: Self-pay | Admitting: Ophthalmology

## 2016-10-22 ENCOUNTER — Emergency Department (HOSPITAL_COMMUNITY): Payer: Medicare Other

## 2016-10-22 ENCOUNTER — Inpatient Hospital Stay (HOSPITAL_COMMUNITY)
Admission: EM | Admit: 2016-10-22 | Discharge: 2016-10-25 | DRG: 291 | Disposition: A | Discharge status: Home Health | Payer: Medicare Other | Attending: Internal Medicine | Admitting: Internal Medicine

## 2016-10-22 ENCOUNTER — Encounter (HOSPITAL_COMMUNITY): Payer: Self-pay | Admitting: *Deleted

## 2016-10-22 DIAGNOSIS — Z87891 Personal history of nicotine dependence: Secondary | ICD-10-CM

## 2016-10-22 DIAGNOSIS — I272 Pulmonary hypertension, unspecified: Secondary | ICD-10-CM | POA: Diagnosis present

## 2016-10-22 DIAGNOSIS — Z992 Dependence on renal dialysis: Secondary | ICD-10-CM

## 2016-10-22 DIAGNOSIS — Z886 Allergy status to analgesic agent status: Secondary | ICD-10-CM

## 2016-10-22 DIAGNOSIS — K5900 Constipation, unspecified: Secondary | ICD-10-CM | POA: Diagnosis present

## 2016-10-22 DIAGNOSIS — K7460 Unspecified cirrhosis of liver: Secondary | ICD-10-CM | POA: Diagnosis present

## 2016-10-22 DIAGNOSIS — E785 Hyperlipidemia, unspecified: Secondary | ICD-10-CM | POA: Diagnosis present

## 2016-10-22 DIAGNOSIS — Z882 Allergy status to sulfonamides status: Secondary | ICD-10-CM | POA: Diagnosis not present

## 2016-10-22 DIAGNOSIS — N186 End stage renal disease: Secondary | ICD-10-CM | POA: Diagnosis present

## 2016-10-22 DIAGNOSIS — D649 Anemia, unspecified: Secondary | ICD-10-CM

## 2016-10-22 DIAGNOSIS — Z79899 Other long term (current) drug therapy: Secondary | ICD-10-CM | POA: Diagnosis not present

## 2016-10-22 DIAGNOSIS — Z96652 Presence of left artificial knee joint: Secondary | ICD-10-CM | POA: Diagnosis present

## 2016-10-22 DIAGNOSIS — J189 Pneumonia, unspecified organism: Secondary | ICD-10-CM | POA: Diagnosis present

## 2016-10-22 DIAGNOSIS — Z9989 Dependence on other enabling machines and devices: Secondary | ICD-10-CM | POA: Diagnosis not present

## 2016-10-22 DIAGNOSIS — N189 Chronic kidney disease, unspecified: Secondary | ICD-10-CM | POA: Diagnosis not present

## 2016-10-22 DIAGNOSIS — B182 Chronic viral hepatitis C: Secondary | ICD-10-CM | POA: Diagnosis present

## 2016-10-22 DIAGNOSIS — K219 Gastro-esophageal reflux disease without esophagitis: Secondary | ICD-10-CM | POA: Diagnosis present

## 2016-10-22 DIAGNOSIS — D631 Anemia in chronic kidney disease: Secondary | ICD-10-CM | POA: Diagnosis present

## 2016-10-22 DIAGNOSIS — J9621 Acute and chronic respiratory failure with hypoxia: Secondary | ICD-10-CM | POA: Diagnosis present

## 2016-10-22 DIAGNOSIS — R0902 Hypoxemia: Secondary | ICD-10-CM | POA: Diagnosis present

## 2016-10-22 DIAGNOSIS — Z881 Allergy status to other antibiotic agents status: Secondary | ICD-10-CM

## 2016-10-22 DIAGNOSIS — I5033 Acute on chronic diastolic (congestive) heart failure: Secondary | ICD-10-CM | POA: Diagnosis present

## 2016-10-22 DIAGNOSIS — Y95 Nosocomial condition: Secondary | ICD-10-CM | POA: Diagnosis present

## 2016-10-22 DIAGNOSIS — E1122 Type 2 diabetes mellitus with diabetic chronic kidney disease: Secondary | ICD-10-CM | POA: Diagnosis present

## 2016-10-22 DIAGNOSIS — R109 Unspecified abdominal pain: Secondary | ICD-10-CM

## 2016-10-22 DIAGNOSIS — G4733 Obstructive sleep apnea (adult) (pediatric): Secondary | ICD-10-CM | POA: Diagnosis present

## 2016-10-22 DIAGNOSIS — Z7951 Long term (current) use of inhaled steroids: Secondary | ICD-10-CM | POA: Diagnosis not present

## 2016-10-22 DIAGNOSIS — I132 Hypertensive heart and chronic kidney disease with heart failure and with stage 5 chronic kidney disease, or end stage renal disease: Principal | ICD-10-CM | POA: Diagnosis present

## 2016-10-22 DIAGNOSIS — I509 Heart failure, unspecified: Secondary | ICD-10-CM

## 2016-10-22 DIAGNOSIS — E119 Type 2 diabetes mellitus without complications: Secondary | ICD-10-CM

## 2016-10-22 LAB — HEMOGLOBIN AND HEMATOCRIT, BLOOD
HCT: 13.9 % — ABNORMAL LOW (ref 39.0–52.0)
HEMOGLOBIN: 4.5 g/dL — AB (ref 13.0–17.0)

## 2016-10-22 LAB — CBC WITH DIFFERENTIAL/PLATELET
Basophils Absolute: 0 10*3/uL (ref 0.0–0.1)
Basophils Relative: 0 %
EOS ABS: 0.1 10*3/uL (ref 0.0–0.7)
Eosinophils Relative: 2 %
HCT: 20.8 % — ABNORMAL LOW (ref 39.0–52.0)
Hemoglobin: 6.9 g/dL — CL (ref 13.0–17.0)
LYMPHS ABS: 1 10*3/uL (ref 0.7–4.0)
Lymphocytes Relative: 15 %
MCH: 31.1 pg (ref 26.0–34.0)
MCHC: 33.2 g/dL (ref 30.0–36.0)
MCV: 93.7 fL (ref 78.0–100.0)
Monocytes Absolute: 0.6 10*3/uL (ref 0.1–1.0)
Monocytes Relative: 8 %
Neutro Abs: 5 10*3/uL (ref 1.7–7.7)
Neutrophils Relative %: 74 %
Platelets: 196 10*3/uL (ref 150–400)
RBC: 2.22 MIL/uL — ABNORMAL LOW (ref 4.22–5.81)
RDW: 15 % (ref 11.5–15.5)
WBC: 6.7 10*3/uL (ref 4.0–10.5)

## 2016-10-22 LAB — COMPREHENSIVE METABOLIC PANEL
ALBUMIN: 3.6 g/dL (ref 3.5–5.0)
ALT: 30 U/L (ref 17–63)
AST: 24 U/L (ref 15–41)
Alkaline Phosphatase: 91 U/L (ref 38–126)
Anion gap: 13 (ref 5–15)
BUN: 21 mg/dL — AB (ref 6–20)
CHLORIDE: 92 mmol/L — AB (ref 101–111)
CO2: 24 mmol/L (ref 22–32)
CREATININE: 8.58 mg/dL — AB (ref 0.61–1.24)
Calcium: 9.3 mg/dL (ref 8.9–10.3)
GFR calc Af Amer: 6 mL/min — ABNORMAL LOW (ref >60)
GFR calc non Af Amer: 5 mL/min — ABNORMAL LOW (ref >60)
GLUCOSE: 131 mg/dL — AB (ref 65–99)
Potassium: 4.3 mmol/L (ref 3.5–5.1)
SODIUM: 129 mmol/L — AB (ref 135–145)
Total Bilirubin: 0.9 mg/dL (ref 0.3–1.2)
Total Protein: 7.1 g/dL (ref 6.5–8.1)

## 2016-10-22 LAB — PREPARE RBC (CROSSMATCH)

## 2016-10-22 LAB — LIPASE, BLOOD: Lipase: 24 U/L (ref 11–51)

## 2016-10-22 LAB — TROPONIN I: TROPONIN I: 0.03 ng/mL — AB (ref <0.03)

## 2016-10-22 MED ORDER — RISPERIDONE 1 MG PO TABS
1.0000 mg | ORAL_TABLET | Freq: Every day | ORAL | Status: DC
  Administered 2016-10-23 – 2016-10-25 (×3): 1 mg via ORAL
  Filled 2016-10-22 (×3): qty 1

## 2016-10-22 MED ORDER — AMLODIPINE BESYLATE 10 MG PO TABS: 10.0000 mg | ORAL_TABLET | Freq: Every day | ORAL | Status: DC

## 2016-10-22 MED ORDER — CAMPHOR-MENTHOL 0.5-0.5 % EX LOTN
1.0000 "application " | TOPICAL_LOTION | Freq: Three times a day (TID) | CUTANEOUS | Status: DC | PRN
  Filled 2016-10-22: qty 222

## 2016-10-22 MED ORDER — HYDROMORPHONE HCL 1 MG/ML IJ SOLN
0.5000 mg | INTRAMUSCULAR | Status: DC | PRN
Start: 2016-10-22 — End: 2016-10-25
  Administered 2016-10-22 – 2016-10-23 (×3): 0.5 mg via INTRAVENOUS
  Filled 2016-10-22 (×3): qty 0.5

## 2016-10-22 MED ORDER — DEXTROSE 5 % IV SOLN: 2.0000 g | INTRAVENOUS | Status: DC

## 2016-10-22 MED ORDER — SODIUM CHLORIDE 0.9 % IV SOLN: INTRAVENOUS | Status: DC

## 2016-10-22 MED ORDER — ACETAMINOPHEN 650 MG RE SUPP: 650.0000 mg | Freq: Four times a day (QID) | RECTAL | Status: DC | PRN

## 2016-10-22 MED ORDER — SODIUM CHLORIDE 0.9 % IV SOLN
Freq: Once | INTRAVENOUS | Status: AC
  Administered 2016-10-22: 23:00:00 via INTRAVENOUS

## 2016-10-22 MED ORDER — VANCOMYCIN HCL 1000 MG IV SOLR
1500.0000 mg | Freq: Once | INTRAVENOUS | Status: AC
  Administered 2016-10-22: 1500 mg via INTRAVENOUS
  Filled 2016-10-22: qty 1500

## 2016-10-22 MED ORDER — LEVOTHYROXINE SODIUM 25 MCG PO TABS
25.0000 ug | ORAL_TABLET | Freq: Every day | ORAL | Status: DC
  Administered 2016-10-23 – 2016-10-25 (×3): 25 ug via ORAL
  Filled 2016-10-22 (×3): qty 1

## 2016-10-22 MED ORDER — IPRATROPIUM-ALBUTEROL 0.5-2.5 (3) MG/3ML IN SOLN
3.0000 mL | Freq: Three times a day (TID) | RESPIRATORY_TRACT | Status: DC
  Administered 2016-10-23: 3 mL via RESPIRATORY_TRACT
  Filled 2016-10-22 (×2): qty 3

## 2016-10-22 MED ORDER — DM-GUAIFENESIN ER 30-600 MG PO TB12
1.0000 | ORAL_TABLET | Freq: Two times a day (BID) | ORAL | Status: DC
  Administered 2016-10-23 – 2016-10-25 (×6): 1 via ORAL
  Filled 2016-10-22 (×6): qty 1

## 2016-10-22 MED ORDER — HYDROXYZINE HCL 25 MG PO TABS: 25.0000 mg | ORAL_TABLET | Freq: Three times a day (TID) | ORAL | Status: DC | PRN

## 2016-10-22 MED ORDER — ONDANSETRON HCL 4 MG/2ML IJ SOLN
4.0000 mg | Freq: Four times a day (QID) | INTRAMUSCULAR | Status: DC | PRN
  Administered 2016-10-23 (×2): 4 mg via INTRAVENOUS
  Filled 2016-10-22 (×2): qty 2

## 2016-10-22 MED ORDER — INSULIN ASPART 100 UNIT/ML ~~LOC~~ SOLN
0.0000 [IU] | Freq: Three times a day (TID) | SUBCUTANEOUS | Status: DC
  Administered 2016-10-23: 3 [IU] via SUBCUTANEOUS
  Administered 2016-10-24: 2 [IU] via SUBCUTANEOUS

## 2016-10-22 MED ORDER — DEXTROSE 5 % IV SOLN
1.0000 g | Freq: Once | INTRAVENOUS | Status: AC
  Administered 2016-10-22: 1 g via INTRAVENOUS
  Filled 2016-10-22: qty 1

## 2016-10-22 MED ORDER — NEPRO/CARBSTEADY PO LIQD: 237.0000 mL | Freq: Three times a day (TID) | ORAL | Status: DC | PRN

## 2016-10-22 MED ORDER — CARVEDILOL 12.5 MG PO TABS
12.5000 mg | ORAL_TABLET | Freq: Two times a day (BID) | ORAL | Status: DC
  Administered 2016-10-23 – 2016-10-25 (×5): 12.5 mg via ORAL
  Filled 2016-10-22 (×5): qty 1

## 2016-10-22 MED ORDER — CALCIUM CARBONATE ANTACID 1250 MG/5ML PO SUSP: 500.0000 mg | Freq: Four times a day (QID) | ORAL | Status: DC | PRN

## 2016-10-22 MED ORDER — LOSARTAN POTASSIUM 50 MG PO TABS
100.0000 mg | ORAL_TABLET | Freq: Every day | ORAL | Status: DC
  Administered 2016-10-23 – 2016-10-24 (×3): 100 mg via ORAL
  Filled 2016-10-22 (×3): qty 2

## 2016-10-22 MED ORDER — ACETAMINOPHEN 325 MG PO TABS
650.0000 mg | ORAL_TABLET | Freq: Four times a day (QID) | ORAL | Status: DC | PRN
  Administered 2016-10-24 (×2): 650 mg via ORAL
  Filled 2016-10-22 (×2): qty 2

## 2016-10-22 MED ORDER — POLYETHYLENE GLYCOL 3350 17 G PO PACK
17.0000 g | PACK | Freq: Every day | ORAL | Status: DC | PRN
  Filled 2016-10-22: qty 1

## 2016-10-22 MED ORDER — RISPERIDONE 0.5 MG PO TABS
1.5000 mg | ORAL_TABLET | Freq: Every day | ORAL | Status: DC
  Administered 2016-10-23 – 2016-10-24 (×3): 1.5 mg via ORAL
  Filled 2016-10-22 (×4): qty 1

## 2016-10-22 MED ORDER — ZOLPIDEM TARTRATE 5 MG PO TABS
5.0000 mg | ORAL_TABLET | Freq: Every evening | ORAL | Status: DC | PRN
  Administered 2016-10-23 – 2016-10-24 (×2): 5 mg via ORAL
  Filled 2016-10-22 (×2): qty 1

## 2016-10-22 MED ORDER — ONDANSETRON HCL 4 MG PO TABS
4.0000 mg | ORAL_TABLET | Freq: Four times a day (QID) | ORAL | Status: DC | PRN
  Administered 2016-10-24: 4 mg via ORAL
  Filled 2016-10-22: qty 1

## 2016-10-22 MED ORDER — SEVELAMER CARBONATE 800 MG PO TABS: 800.0000 mg | ORAL_TABLET | Freq: Two times a day (BID) | ORAL | Status: DC | PRN

## 2016-10-22 MED ORDER — IPRATROPIUM-ALBUTEROL 0.5-2.5 (3) MG/3ML IN SOLN
3.0000 mL | Freq: Four times a day (QID) | RESPIRATORY_TRACT | Status: DC
  Administered 2016-10-23: 3 mL via RESPIRATORY_TRACT

## 2016-10-22 MED ORDER — PANTOPRAZOLE SODIUM 40 MG PO TBEC
40.0000 mg | DELAYED_RELEASE_TABLET | Freq: Every day | ORAL | Status: DC
Start: 2016-10-23 — End: 2016-10-25
  Administered 2016-10-23 – 2016-10-25 (×3): 40 mg via ORAL
  Filled 2016-10-22 (×3): qty 1

## 2016-10-22 MED ORDER — SEVELAMER CARBONATE 800 MG PO TABS
800.0000 mg | ORAL_TABLET | Freq: Three times a day (TID) | ORAL | Status: DC
  Administered 2016-10-23 – 2016-10-25 (×5): 800 mg via ORAL
  Filled 2016-10-22 (×6): qty 1

## 2016-10-22 MED ORDER — PRAVASTATIN SODIUM 20 MG PO TABS
20.0000 mg | ORAL_TABLET | Freq: Every day | ORAL | Status: DC
  Administered 2016-10-23 – 2016-10-24 (×2): 20 mg via ORAL
  Filled 2016-10-22 (×2): qty 1

## 2016-10-22 MED ORDER — CALCIUM CARBONATE ANTACID 500 MG PO CHEW
2.0000 | CHEWABLE_TABLET | Freq: Three times a day (TID) | ORAL | Status: DC
  Administered 2016-10-23 – 2016-10-25 (×6): 400 mg via ORAL
  Filled 2016-10-22 (×6): qty 2

## 2016-10-22 MED ORDER — SORBITOL 70 % SOLN
30.0000 mL | Status: DC | PRN
  Administered 2016-10-23: 30 mL via ORAL
  Filled 2016-10-22 (×2): qty 30

## 2016-10-22 MED ORDER — VANCOMYCIN HCL IN DEXTROSE 1-5 GM/200ML-% IV SOLN
1000.0000 mg | INTRAVENOUS | Status: DC
  Filled 2016-10-22: qty 200

## 2016-10-22 MED ORDER — ALBUTEROL SULFATE (2.5 MG/3ML) 0.083% IN NEBU: 2.5000 mg | INHALATION_SOLUTION | RESPIRATORY_TRACT | Status: DC | PRN

## 2016-10-22 MED ORDER — CLONIDINE HCL 0.2 MG/24HR TD PTWK
0.2000 mg | MEDICATED_PATCH | TRANSDERMAL | Status: DC
  Administered 2016-10-24: 0.2 mg via TRANSDERMAL
  Filled 2016-10-22: qty 1

## 2016-10-22 MED ORDER — DOCUSATE SODIUM 283 MG RE ENEM
1.0000 | ENEMA | RECTAL | Status: DC | PRN
  Filled 2016-10-22: qty 1

## 2016-10-22 NOTE — ED Notes (Signed)
maxipime started iv

## 2016-10-22 NOTE — Progress Notes (Signed)
CRITICAL VALUE ALERT  Critical Value:  HGB 4.5  Date & Time Notied:  10/22/2016, 2240  Provider Notified: Lorin Mercy  Orders Received/Actions taken: Received orders to transfuse 2u PRBC  Eleanora Neighbor, RN

## 2016-10-22 NOTE — ED Notes (Signed)
EDP at bedside  

## 2016-10-22 NOTE — ED Notes (Signed)
EMT exposed while attempting IV access. Labs drawn accordingly per protocol and sent to lab.

## 2016-10-22 NOTE — ED Notes (Signed)
Patient was source for blood exposure to staff member, per hospital policy blood was drawn for exposure panel.

## 2016-10-22 NOTE — ED Provider Notes (Signed)
[facility] DEPT Provider Note   CSN: [id number] Arrival date & time: 10/22/16  1750     History   Chief Complaint Chief Complaint  Patient presents with  . Shortness of Breath  . Abdominal Pain  . Chest Pain    HPI Anthony Davis is a 82 y.o. male.  HPI   Pt with hx ESRD on dialysis (last dialyzed two days ago), DM, pulmonary hypertension, hepatitis, asthma, HLD, gerd p/w SOB and dry cough x 3 days.  Has associated sharp central chest pain that occurs randomly, lasts about 5 minutes at a time.  Has had subjective fevers, nasal congestion, sore throat with this.  Previously was on oxygen at home but has not been for the past 6 months.  Has an appointment with his pulmonologist in two days.   Dialysis on [address].  Denies abnormal bleeding or dark stools.   Past Medical History:  Diagnosis Date  . Anemia    low iron  . Arthritis   . Asthma   . Chronic kidney disease    dialysis M/W/F  . Constipation   . Diabetes mellitus without complication ([place])    not on any medications  . GERD (gastroesophageal reflux disease)   . Headache   . Hepatitis    Hepatitis C   . High cholesterol   . Hypertension   . Pulmonary hypertension ([place])   . Shortness of breath dyspnea   . Sleep apnea    does not use cpap    There are no active problems to display for this patient.   Past Surgical History:  Procedure Laterality Date  . AV FISTULA PLACEMENT    . CATARACT EXTRACTION W/PHACO Left 03/17/2015   Procedure: Cancelled procedure;  Surgeon: Marylynn Pearson, MD;  Location: [facility];  Service: Ophthalmology;  Laterality: Left;  . COLONOSCOPY    . JOINT REPLACEMENT Left    knee  . PENILE PROSTHESIS IMPLANT         Home Medications    Prior to Admission medications   Medication Sig Start Date End Date Taking? Authorizing Provider  albuterol (PROVENTIL HFA;VENTOLIN HFA) 108 (90 BASE) MCG/ACT inhaler Inhale into the lungs every 6 (six) hours as needed for wheezing or shortness  of breath.    [provider]  amLODipine (NORVASC) 10 MG tablet Take 10 mg by mouth daily.    [provider]  bisacodyl (DULCOLAX) 5 MG EC tablet Take 5 mg by mouth daily as needed for mild constipation or moderate constipation.    [provider]  calcium carbonate (TUMS - DOSED IN MG ELEMENTAL CALCIUM) 500 MG chewable tablet Chew 1 tablet by mouth 2 (two) times daily.    [provider]  cinacalcet (SENSIPAR) 30 MG tablet Take 30 mg by mouth daily.    [provider]  cloNIDine (CATAPRES - DOSED IN MG/24 HR) 0.2 mg/24hr patch Place 0.2 mg onto the skin once a week.    [provider]  docusate sodium (COLACE) 100 MG capsule Take 100 mg by mouth 2 (two) times daily.    [provider]  doxazosin (CARDURA) 2 MG tablet Take 2 mg by mouth daily.    [provider]  esomeprazole (NEXIUM) 40 MG capsule Take 40 mg by mouth daily at 12 noon.    [provider]  lactulose (CHRONULAC) 10 GM/15ML solution Take by mouth 3 (three) times daily.    [provider]  losartan (COZAAR) 100 MG tablet Take 100 mg by mouth  daily.    [provider]  lovastatin (MEVACOR) 20 MG tablet Take 20 mg by mouth at bedtime.    [provider]  metoprolol (LOPRESSOR) 50 MG tablet Take 50 mg by mouth 2 (two) times daily.    [provider]  polyethylene glycol (MIRALAX / GLYCOLAX) packet Take 17 g by mouth daily.    [provider]  QUEtiapine Fumarate (SEROQUEL XR) 150 MG 24 hr tablet Take 150 mg by mouth at bedtime.    [provider]  risperiDONE (RISPERDAL) 1 MG tablet Take 1 mg by mouth at bedtime.    [provider]  tamsulosin (FLOMAX) 0.4 MG CAPS capsule Take 0.4 mg by mouth daily.    [provider]    Family History No family history on file.  Social History Social History  Substance Use Topics  . Smoking status: Former Smoker    Quit date: 03/15/1985  .  Smokeless tobacco: Never Used  . Alcohol use Yes     Comment: occasional     Allergies   Ibuprofen; Sulfa antibiotics; and Tetracyclines & related   Review of Systems Review of Systems  All other systems reviewed and are negative.    Physical Exam Updated Vital Signs BP (!) 180/66 (BP Location: Right Arm)   Pulse (!) 59   Temp 98.2 F (36.8 C) (Oral)   Resp (!) 21   Ht $R'5\' 10"'qf$  (1.778 m)   Wt 82.6 kg (182 lb)   SpO2 91%   BMI 26.11 kg/m   Physical Exam  Constitutional: He appears well-developed and well-nourished. No distress.  HENT:  Head: Normocephalic and atraumatic.  Neck: Neck supple.  Cardiovascular: Normal rate and regular rhythm.   Pulmonary/Chest: Effort normal. No respiratory distress. He has decreased breath sounds in the right lower field. He has no wheezes. He has no rales.  Pt 97% on 2L [place]  Abdominal: Soft. He exhibits no distension and no mass. There is tenderness (diffuse, mild). There is no rebound and no guarding.  Neurological: He is alert. He exhibits normal muscle tone.  Skin: He is not diaphoretic.  Nursing note and vitals reviewed.    ED Treatments / Results  Labs (all labs ordered are listed, but only abnormal results are displayed) Labs Reviewed  COMPREHENSIVE METABOLIC PANEL - Abnormal; Notable for the following:       Result Value   Sodium 129 (*)    Chloride 92 (*)    Glucose, Bld 131 (*)    BUN 21 (*)    Creatinine, Ser 8.58 (*)    GFR calc non Af Amer 5 (*)    GFR calc Af Amer 6 (*)    All other components within normal limits  CBC WITH DIFFERENTIAL/PLATELET - Abnormal; Notable for the following:    RBC 2.22 (*)    Hemoglobin 6.9 (*)    HCT 20.8 (*)    All other components within normal limits  TROPONIN I - Abnormal; Notable for the following:    Troponin I 0.03 (*)    All other components within normal limits  LIPASE, BLOOD  HEMOGLOBIN AND HEMATOCRIT, BLOOD  POC OCCULT BLOOD, ED  TYPE AND SCREEN    EKG  EKG  Interpretation  Date/Time:  Sunday October 22 2016 18:19:47 EDT Ventricular Rate:  77 PR Interval:    QRS Duration: 105 QT Interval:  428 QTC Calculation: 485 R Axis:   24 Text Interpretation:  Sinus rhythm Low voltage, extremity leads Borderline prolonged QT  interval Baseline wander in lead(s) V6 No significant change since last tracing Confirmed by Alfonzo Beers [phone]) on 10/22/2016 6:21:51 PM       Radiology Dg Chest 2 View  Result Date: 10/22/2016 CLINICAL DATA:  Chest pain shortness of breath EXAM: CHEST  2 VIEW COMPARISON:  09/11/2016 FINDINGS: Elevation of the right diaphragm. Small bilateral pleural effusions. Bibasilar atelectasis or mild infiltrates. Borderline cardiomegaly with mild central congestion. No pneumothorax. IMPRESSION: 1. Small bilateral pleural effusions with bibasilar atelectasis or infiltrates 2. Borderline cardiomegaly with central vascular congestion. Electronically Signed   By: Donavan Foil M.D.   On: 10/22/2016 18:52    Procedures Procedures (including critical care time)  Medications Ordered in ED Medications  ceFEPIme (MAXIPIME) 1 g in dextrose 5 % 50 mL IVPB (not administered)  vancomycin (VANCOCIN) 1,500 mg in sodium chloride 0.9 % 250 mL IVPB (not administered)  vancomycin (VANCOCIN) IVPB 1000 mg/200 mL premix (not administered)     Initial Impression / Assessment and Plan / ED Course  I have reviewed the triage vital signs and the nursing notes.  Pertinent labs & imaging results that were available during my care of the patient were reviewed by me and considered in my medical decision making (see chart for details).     Pt with hs ESRD on dialysis, pulmonary HTN, cirrhosis, asthma, HLD, gerd p/w 3 days of dry cough, SOB, CP, subjective fever.  Hypoxic on arrival, requiring oxygen.  CXR demonstrates pleural effusions and possible infiltrates. Treated for HCAP. Pt also with Hgb 6.9, decreased significantly from 10.4 on 09/12/16 per [facility]  records. Admitted to [facility], Dr Lorin Mercy accepting.    Final Clinical Impressions(s) / ED Diagnoses   Final diagnoses:  Hypoxia  HCAP (healthcare-associated pneumonia)  Anemia, unspecified type    New Prescriptions New Prescriptions   No medications on file     Clayton Bibles, Hershal Coria 10/22/16 1956    Alfonzo Beers, MD 10/22/16 [phone]

## 2016-10-22 NOTE — Progress Notes (Signed)
Pharmacy Antibiotic Note  Anthony Davis is a 82 y.o. male admitted on 10/22/2016 with SOB x3 days.  Pharmacy has been consulted for Vancomycin dosing for pneumonia. Cefepime 1g IV x1 has been ordered per MD.   Plan: Vancomycin 1500 mg IV x1 , then 1000 mg IV qHD MWF Monitor clinical status, renal function daily and pre HD vanc levels.    Height: $Remove'5\' 10"'seFopJL$  (177.8 cm) Weight: 182 lb (82.6 kg) IBW/kg (Calculated) : 73  Temp (24hrs), Avg:98.2 F (36.8 C), Min:98.2 F (36.8 C), Max:98.2 F (36.8 C)   Recent Labs Lab 10/22/16 1827  WBC 6.7  CREATININE 8.58*    Estimated Creatinine Clearance: 7.2 mL/min (A) (by C-G formula based on SCr of 8.58 mg/dL (H)).    Allergies  Allergen Reactions  . Ibuprofen Rash  . Sulfa Antibiotics Rash  . Tetracyclines & Related Rash    Antimicrobials this admission: Vancomycin 7/22 >  Cefepime 7/22 x1 in ED  Dose adjustments this admission:   Microbiology results:  Thank you for allowing pharmacy to be a part of this patient's care. Nicole Cella, RPh Clinical Pharmacist Pager: [phone] 10/22/2016 7:24 PM

## 2016-10-22 NOTE — Progress Notes (Signed)
Pt is c/o abdominal pain at 9/10. No prn medications to give at this time. Pt is receiving blood. This nurse notified MD and awaiting response at this time. This nurse it at bedside monitoring pt.   Eleanora Neighbor, RN

## 2016-10-22 NOTE — ED Notes (Signed)
Report called to the rn on 6e

## 2016-10-22 NOTE — H&P (Addendum)
History and Physical    Calel Pisarski RDE:[id number] DOB: 05-19-1934 DOA: 10/22/2016  PCP: Joan Mayans, [facility] Consultants:  [facility] - nephrology; [facility] - pulmonology Patient coming from: Home - lives with wife; NOK: wife, [phone]  Chief Complaint: cough  HPI: Anthony Davis is a 82 y.o. male with medical history significant of OSA (recently started on CPAP), pulmonary HTN, HTN, HLD, remote Hep C, DM (diet controlled), and ESRD on MWF HD presenting with cough.  Patient has been gagging with nothing coming up for 3 days.  Abdominal pain for coughing.  Coughed yesterday all day long. +SOB. Nonproductive cough other than white sputum a couple of times.  +sick contacts at HD.  No fever.  No chills.  +abdominal pain, mid-umbilical.  He saw blood in his stools a couple of months ago, "straining and bust a vessel", bright red blood.  No dark stools now.  Last BM was about 3 days ago, which is normal for him and sometimes even longer.  +weak/tired quite a while, maybe 2-3 weeks. Has required a cane recently and had falls recently.   ED Course:  Hypoxic on arrival, required O2.  CXR with pleural effusions and possible infiltrates.  Treated for HCAP.  Also with Hgb 6.9, decreased significantly from 10.4 on 6/12 (although patient reports Hgb 7 at dialysis Friday).  Review of Systems: As per HPI; otherwise review of systems reviewed and negative.   Ambulatory Status:  Ambulates with a cane over the last few weeks  Past Medical History:  Diagnosis Date  . Anemia    low iron  . Arthritis   . Asthma   . Chronic kidney disease    dialysis M/W/F  . Constipation   . Diabetes mellitus without complication ([place])    not on any medications  . GERD (gastroesophageal reflux disease)   . Headache   . Hepatitis    Hepatitis C   . High cholesterol   . Hypertension   . Pulmonary hypertension ([place])   . Shortness of breath dyspnea   . Sleep apnea    Got CPAP about 1 week ago, setting is  five    Past Surgical History:  Procedure Laterality Date  . AV FISTULA PLACEMENT    . CATARACT EXTRACTION W/PHACO Left 03/17/2015   Procedure: Cancelled procedure;  Surgeon: Marylynn Pearson, MD;  Location: [facility];  Service: Ophthalmology;  Laterality: Left;  . COLONOSCOPY    . JOINT REPLACEMENT Left    knee  . PENILE PROSTHESIS IMPLANT      Social History   Social History  . Marital status: Married    Spouse name: N/A  . Number of children: N/A  . Years of education: N/A   Occupational History  . retired    Social History Main Topics  . Smoking status: Former Smoker    Quit date: 03/15/1985  . Smokeless tobacco: Never Used  . Alcohol use No  . Drug use: No  . Sexual activity: Not on file   Other Topics Concern  . Not on file   Social History Narrative  . No narrative on file    Allergies  Allergen Reactions  . Ace Inhibitors Other (See Comments)    Other reaction(s): ELEVATED CREATININE  . Propofol Other (See Comments)    Unknown reaction - reported by [facility] 09/11/16  . Sulfamethoxazole Hives  . Ibuprofen Rash  . Sulfa Antibiotics Rash  . Tetracycline Hives, Swelling and Rash    History reviewed. No pertinent  family history.  Prior to Admission medications   Medication Sig Start Date End Date Taking? Authorizing Provider  albuterol (PROVENTIL HFA;VENTOLIN HFA) 108 (90 BASE) MCG/ACT inhaler Inhale into the lungs every 6 (six) hours as needed for wheezing or shortness of breath.    [provider]  amLODipine (NORVASC) 10 MG tablet Take 10 mg by mouth daily.    [provider]  bisacodyl (DULCOLAX) 5 MG EC tablet Take 5 mg by mouth daily as needed for mild constipation or moderate constipation.    [provider]  calcium carbonate (TUMS - DOSED IN MG ELEMENTAL CALCIUM) 500 MG chewable tablet Chew 1 tablet by mouth 2 (two) times daily.    [provider]  cinacalcet (SENSIPAR) 30 MG tablet Take 30 mg by mouth daily.     [provider]  cloNIDine (CATAPRES - DOSED IN MG/24 HR) 0.2 mg/24hr patch Place 0.2 mg onto the skin once a week.    [provider]  docusate sodium (COLACE) 100 MG capsule Take 100 mg by mouth 2 (two) times daily.    [provider]  doxazosin (CARDURA) 2 MG tablet Take 2 mg by mouth daily.    [provider]  esomeprazole (NEXIUM) 40 MG capsule Take 40 mg by mouth daily at 12 noon.    [provider]  lactulose (CHRONULAC) 10 GM/15ML solution Take by mouth 3 (three) times daily.    [provider]  losartan (COZAAR) 100 MG tablet Take 100 mg by mouth daily.    [provider]  lovastatin (MEVACOR) 20 MG tablet Take 20 mg by mouth at bedtime.    [provider]  metoprolol (LOPRESSOR) 50 MG tablet Take 50 mg by mouth 2 (two) times daily.    [provider]  polyethylene glycol (MIRALAX / GLYCOLAX) packet Take 17 g by mouth daily.    [provider]  QUEtiapine Fumarate (SEROQUEL XR) 150 MG 24 hr tablet Take 150 mg by mouth at bedtime.    [provider]  risperiDONE (RISPERDAL) 1 MG tablet Take 1 mg by mouth at bedtime.    [provider]  tamsulosin (FLOMAX) 0.4 MG CAPS capsule Take 0.4 mg by mouth daily.    [provider]    Physical Exam: Vitals:   10/22/16 2015 10/22/16 2249 10/22/16 2335 10/23/16 0053  BP: (!) 175/63 (!) 190/87 (!) 185/78   Pulse: 67 74 67   Resp: (!) 35 20 20   Temp:  97.9 F (36.6 C) 98.2 F (36.8 C)   TempSrc:  Oral Oral   SpO2: 95% 95% 98% 97%  Weight:      Height:  $Remove'5\' 10"'MWKFrOz$  (1.778 m)       General:  Appears calm and comfortable and is NAD Eyes:  PERRL, EOMI, normal lids, iris ENT:  grossly normal hearing, lips & tongue, mmm Neck:  no LAD, masses or thyromegaly Cardiovascular:  RRR, no m/r/g. No LE edema.  Respiratory:  CTA bilaterally, no w/r/r. Normal respiratory effort. Abdomen:  soft, mildly TTP in periumbilical region, nd,  NABS Skin:  no rash or induration seen on limited exam Musculoskeletal:  grossly normal tone BUE/BLE, good ROM, no bony abnormality Psychiatric:  grossly normal mood and affect, speech fluent and appropriate, AOx3 Neurologic:  CN 2-12 grossly intact, moves all extremities in coordinated fashion, sensation intact  Labs on Admission: I have personally reviewed following labs and imaging studies  CBC:  Recent Labs Lab 10/22/16 1827 10/22/16 2210  WBC 6.7  --   NEUTROABS 5.0  --   HGB 6.9* 4.5*  HCT 20.8* 13.9*  MCV 93.7  --   PLT 196  --    Basic Metabolic Panel:  Recent Labs Lab 10/22/16 1827  NA 129*  K 4.3  CL 92*  CO2 24  GLUCOSE 131*  BUN 21*  CREATININE 8.58*  CALCIUM 9.3   GFR: Estimated Creatinine Clearance: 7.2 mL/min (A) (by C-G formula based on SCr of 8.58 mg/dL (H)). Liver Function Tests:  Recent Labs Lab 10/22/16 1827  AST 24  ALT 30  ALKPHOS 91  BILITOT 0.9  PROT 7.1  ALBUMIN 3.6    Recent Labs Lab 10/22/16 1827  LIPASE 24   No results for input(s): AMMONIA in the last 168 hours. Coagulation Profile: No results for input(s): INR, PROTIME in the last 168 hours. Cardiac Enzymes:  Recent Labs Lab 10/22/16 1827  TROPONINI 0.03*   BNP (last 3 results) No results for input(s): PROBNP in the last 8760 hours. HbA1C: No results for input(s): HGBA1C in the last 72 hours. CBG:  Recent Labs Lab 10/22/16 2242  GLUCAP 152*   Lipid Profile: No results for input(s): CHOL, HDL, LDLCALC, TRIG, CHOLHDL, LDLDIRECT in the last 72 hours. Thyroid Function Tests: No results for input(s): TSH, T4TOTAL, FREET4, T3FREE, THYROIDAB in the last 72 hours. Anemia Panel: No results for input(s): VITAMINB12, FOLATE, FERRITIN, TIBC, IRON, RETICCTPCT in the last 72 hours. Urine analysis: No results found for: COLORURINE, APPEARANCEUR, LABSPEC, PHURINE, GLUCOSEU, HGBUR, BILIRUBINUR, KETONESUR, PROTEINUR, UROBILINOGEN, NITRITE, LEUKOCYTESUR  Creatinine  Clearance: Estimated Creatinine Clearance: 7.2 mL/min (A) (by C-G formula based on SCr of 8.58 mg/dL (H)).  Sepsis Labs: $RemoveBefo'@[id number]'[id number]$ (procalcitonin:4,lacticidven:4) )No results found for this or any previous visit (from the past 240 hour(s)).   Radiological Exams on Admission: Ct Abdomen Pelvis Wo Contrast  Result Date: 10/23/2016 CLINICAL DATA:  Sudden onset of abdominal pain. Decreased hemoglobin. EXAM: CT ABDOMEN AND PELVIS WITHOUT CONTRAST TECHNIQUE: Multidetector CT imaging of the abdomen and pelvis was performed following the standard protocol without IV contrast. COMPARISON:  CT 01/16/2015 FINDINGS: Lower chest: Bilateral pleural effusions, moderate on the right and small on the left. These measure simple fluid density. Adjacent compressive atelectasis. There multi chamber cardiomegaly with mild septal thickening. Scattered basilar calcifications. Hepatobiliary: No evidence for focal lesion allowing for lack contrast. Gallbladder physiologically distended, no calcified stone. No biliary dilatation. Pancreas: No ductal dilatation or inflammation. Spleen: Normal in size without focal abnormality. Adrenals/Urinary Tract: Mild thickening on the left adrenal gland without discrete nodule. No right adrenal nodule. Bilateral renal cortical thinning. Left renal cyst. Small hyperdense cyst in the mid left kidney. Urinary bladder is nondistended. Mild bladder wall thickening. Stomach/Bowel: Stomach is physiologically distended. No small bowel dilatation or obstruction. There is mild fecalization of distal small bowel contents. Moderate to large stool in the cecum and ascending colon. Mild gaseous distention of transverse colon. Small volume of stool in the more distal colon. Descending and sigmoid colonic diverticulosis without acute inflammation. There is sigmoid colonic redundancy. Normal appendix. Vascular/Lymphatic: Aortic and branch atherosclerosis. No pathologically enlarged abdominal or pelvic lymph  nodes. Reproductive: Penile prosthesis in place. Unremarkable prostate gland. Other: No free air, free fluid, or intra-abdominal fluid collection. No evidence of retroperitoneal hemorrhage. Mild presacral edema is chronic. Musculoskeletal: There are no acute or suspicious osseous abnormalities. Bilateral intramuscular gluteal calcifications are unchanged. IMPRESSION: 1. No findings to suggest intra-abdominal hemorrhage. 2. Colonic diverticulosis without diverticulitis. Colonic redundancy with moderate proximal stool  burden, can be seen with constipation. No acute bowel inflammation. 3. Moderate right and small left pleural effusion. Cardiomegaly with septal thickening, findings consistent with CHF. 4.  Aortic Atherosclerosis (ICD10-I70.0). Electronically Signed   By: Jeb Levering M.D.   On: 10/23/2016 00:54   Dg Chest 2 View  Result Date: 10/22/2016 CLINICAL DATA:  Chest pain shortness of breath EXAM: CHEST  2 VIEW COMPARISON:  09/11/2016 FINDINGS: Elevation of the right diaphragm. Small bilateral pleural effusions. Bibasilar atelectasis or mild infiltrates. Borderline cardiomegaly with mild central congestion. No pneumothorax. IMPRESSION: 1. Small bilateral pleural effusions with bibasilar atelectasis or infiltrates 2. Borderline cardiomegaly with central vascular congestion. Electronically Signed   By: Donavan Foil M.D.   On: 10/22/2016 18:52    EKG: Independently reviewed.  NSR with rate 77; no evidence of acute ischemia; NSCSLT  Assessment/Plan Principal Problem:   Acute on chronic respiratory failure with hypoxia (HCC) Active Problems:   CHF exacerbation (HCC)   ESRD on dialysis ([place])   Diabetes mellitus type 2, diet-controlled ([place])   Anemia due to chronic kidney disease   OSA on CPAP   Acute on chronic respiratory failure -Patient with a multitude of chronic respiratory issues including pulmonary HTN, h/o tobacco dependence, and OSA on CPAP -Presenting with nonproductive cough,  SOB -Imaging appears to indicate that this is due to volume overload (see below) -He was hypoxic on arrival with O2 level of 91% on RA -Improved with [place] O2, will continue -Will continue qhs CPAP  CHF exacerbation -Echo in 6/17 showed at least mild pulmonary HTN with preserved EF 50-55% -Initial concern for PNA but CXR more consistent with pulmonary edema; suspect that volume overload is his underlying etiology for respiratory failure -Normal WBC count, no fever; will not give additional antibiotics at this time -Will check BNP - although this is unlikely to be normal based on ESRD.  Last BNP was 46k in 3/18 (this can be the basis for comparison) -Initial EKG unremarkable -Will admit with telemetry -Will request repeat echocardiogram  -Will start ASA -Will continue Cozaar - although it is not clear how efficacious this will be in light of his ESRD -Continue Coreg -Will stop Norvasc in case this is contributing to his volume overload (despite lack of LE edema) -CHF order set utilized; may need CHF team consult but will hold until Echo results are available -Lasix is likely ineffective in this dialysis patient -He appears to be stable enough at this time to wait for hemodialysis tomorrow; I have left a message on the Maintenance HD machine for now including that he appears to be volume overloaded.  If he has worsening symptoms overnight he will need more urgent evaluation. -Continue  O2 for now -Repeat EKG in AM -Will r/o with serial troponins although doubt ACS based on symptoms (suspect that troponin 0.03 is related to ESRD) -Na++ 129 is likely related to volume overload, will follow after HD  Anemia with abdominal pain -Hgb 6.9 (4.5 on H&H but this may be artificially low since the 6.9 is c/w his Friday 7 at HD); 10.4 on 6/12 -Patient with chronic anemia in the 10 range -Uncertain why he has this decrease now -He reports Hgb 7 on Friday and it was 6.9 on CBC today; repeat on H&H was  4.5 but it is my understanding that the H&H hemoglobin is often significantly different. -Regardless, he requires transfusion at this time and has been ordered to have 2 units over 4 hours each. -This is likely to  worsen his volume overload somewhat, but hopefully it will be given slowly enough to make this not a major consideration. -He complained of abdominal pain in the ER but then complained of marked worsening upon arrival on the floor. -Because of this marked worsening, a stat non-contrast CT was obtained which ruled out intraabdominal hemorrhage. -He was given low-dose Dilaudid for pain. -His pain may be related to constipation and prn medications as well as standing colace have been ordered. -Hemoccult pending. -Will recheck CBC post-transfusion to determine if additional units are needed. -If Hgb again falls, he may need GI evaluation, as well.  ESRD on HD -BUN 21/Creatinine 8.58/GFR 5; 76/11.7/5 on 6/12 -MWF HD in [facility] -Will request nephrology consult for HD here Monday AM (in a few hours)  DM -Glucose 131, 152 -Reports that he no longer has diabetes -His last A1c was 4.6 and so this appears to be the case -Will follow and treat with SSI for now      DVT prophylaxis: SCDs Code Status:  Full - confirmed with patient Family Communication: None present Disposition Plan:  Home once clinically improved Consults called: Nephrology for HD; may need cardiology and/or GI  Admission status: Admit - It is my clinical opinion that admission to [place] is reasonable and necessary because this patient will require at least 2 midnights in the hospital to treat this condition based on the medical complexity of the problems presented.  Given the aforementioned information, the predictability of an adverse outcome is felt to be significant.    Karmen Bongo MD [facility]  If 7PM-7AM, please contact night-coverage [email or web address] Password [email or web address]  10/23/2016, 1:32 AM

## 2016-10-22 NOTE — ED Triage Notes (Signed)
Patient comes in per [facility] with sob for 3 days. Diminished right lower lobe. Albuterol 5 mg given. Patient states relief after breathing tx. Denies cp. No IV access. Dialysis patient, MWF. Ems v/s 196/99, 87 HR, 99% 2L O2 acute and 94% on RA, 22 RR. Nonproductive coughing. abd pain fm coughing.

## 2016-10-22 NOTE — ED Notes (Signed)
Vancomycin added to iv.  Food given

## 2016-10-23 ENCOUNTER — Inpatient Hospital Stay (HOSPITAL_COMMUNITY): Payer: Medicare Other

## 2016-10-23 DIAGNOSIS — I509 Heart failure, unspecified: Secondary | ICD-10-CM

## 2016-10-23 DIAGNOSIS — G4733 Obstructive sleep apnea (adult) (pediatric): Secondary | ICD-10-CM

## 2016-10-23 DIAGNOSIS — E119 Type 2 diabetes mellitus without complications: Secondary | ICD-10-CM

## 2016-10-23 DIAGNOSIS — Z992 Dependence on renal dialysis: Secondary | ICD-10-CM

## 2016-10-23 DIAGNOSIS — N186 End stage renal disease: Secondary | ICD-10-CM

## 2016-10-23 DIAGNOSIS — Z9989 Dependence on other enabling machines and devices: Secondary | ICD-10-CM

## 2016-10-23 DIAGNOSIS — J9621 Acute and chronic respiratory failure with hypoxia: Secondary | ICD-10-CM | POA: Diagnosis present

## 2016-10-23 DIAGNOSIS — N189 Chronic kidney disease, unspecified: Secondary | ICD-10-CM

## 2016-10-23 DIAGNOSIS — D631 Anemia in chronic kidney disease: Secondary | ICD-10-CM | POA: Diagnosis present

## 2016-10-23 LAB — CBC
HEMATOCRIT: 26.3 % — AB (ref 39.0–52.0)
Hemoglobin: 9.1 g/dL — ABNORMAL LOW (ref 13.0–17.0)
MCH: 31.4 pg (ref 26.0–34.0)
MCHC: 34.6 g/dL (ref 30.0–36.0)
MCV: 90.7 fL (ref 78.0–100.0)
PLATELETS: 137 10*3/uL — AB (ref 150–400)
RBC: 2.9 MIL/uL — ABNORMAL LOW (ref 4.22–5.81)
RDW: 15.1 % (ref 11.5–15.5)
WBC: 5.1 10*3/uL (ref 4.0–10.5)

## 2016-10-23 LAB — RENAL FUNCTION PANEL
ALBUMIN: 3.4 g/dL — AB (ref 3.5–5.0)
Anion gap: 7 (ref 5–15)
BUN: 20 mg/dL (ref 6–20)
CHLORIDE: 92 mmol/L — AB (ref 101–111)
CO2: 27 mmol/L (ref 22–32)
CREATININE: 8.09 mg/dL — AB (ref 0.61–1.24)
Calcium: 8.8 mg/dL — ABNORMAL LOW (ref 8.9–10.3)
GFR calc Af Amer: 6 mL/min — ABNORMAL LOW (ref >60)
GFR calc non Af Amer: 6 mL/min — ABNORMAL LOW (ref >60)
GLUCOSE: 125 mg/dL — AB (ref 65–99)
PHOSPHORUS: 2.5 mg/dL (ref 2.5–4.6)
POTASSIUM: 3.8 mmol/L (ref 3.5–5.1)
Sodium: 126 mmol/L — ABNORMAL LOW (ref 135–145)

## 2016-10-23 LAB — HEMOGLOBIN AND HEMATOCRIT, BLOOD
HCT: 27.1 % — ABNORMAL LOW (ref 39.0–52.0)
Hemoglobin: 9.1 g/dL — ABNORMAL LOW (ref 13.0–17.0)

## 2016-10-23 LAB — GLUCOSE, CAPILLARY
GLUCOSE-CAPILLARY: 151 mg/dL — AB (ref 65–99)
Glucose-Capillary: 152 mg/dL — ABNORMAL HIGH (ref 65–99)
Glucose-Capillary: 102 mg/dL — ABNORMAL HIGH (ref 65–99)
Glucose-Capillary: 173 mg/dL — ABNORMAL HIGH (ref 65–99)
Glucose-Capillary: 74 mg/dL (ref 65–99)

## 2016-10-23 LAB — MRSA PCR SCREENING: MRSA by PCR: POSITIVE — AB

## 2016-10-23 MED ORDER — HYDRALAZINE HCL 20 MG/ML IJ SOLN: 5.0000 mg | INTRAMUSCULAR | Status: DC | PRN

## 2016-10-23 MED ORDER — DOXERCALCIFEROL 4 MCG/2ML IV SOLN
4.0000 ug | INTRAVENOUS | Status: DC
  Administered 2016-10-23 – 2016-10-25 (×2): 4 ug via INTRAVENOUS
  Filled 2016-10-23: qty 2

## 2016-10-23 MED ORDER — DOXERCALCIFEROL 4 MCG/2ML IV SOLN: 4.0000 ug | INTRAVENOUS | Status: DC

## 2016-10-23 MED ORDER — ASPIRIN EC 81 MG PO TBEC
81.0000 mg | DELAYED_RELEASE_TABLET | Freq: Every day | ORAL | Status: DC
  Administered 2016-10-23 – 2016-10-25 (×3): 81 mg via ORAL
  Filled 2016-10-23 (×3): qty 1

## 2016-10-23 MED ORDER — LACTULOSE 10 GM/15ML PO SOLN
20.0000 g | Freq: Once | ORAL | Status: AC
Start: 2016-10-23 — End: 2016-10-23
  Administered 2016-10-23: 20 g via ORAL
  Filled 2016-10-23: qty 30

## 2016-10-23 MED ORDER — POLYETHYLENE GLYCOL 3350 17 G PO PACK
17.0000 g | PACK | Freq: Every day | ORAL | Status: DC
  Administered 2016-10-23 – 2016-10-25 (×3): 17 g via ORAL
  Filled 2016-10-23 (×3): qty 1

## 2016-10-23 MED ORDER — SORBITOL 70 % SOLN
960.0000 mL | TOPICAL_OIL | Freq: Once | ORAL | Status: AC
  Administered 2016-10-24: 960 mL via RECTAL
  Filled 2016-10-23: qty 240

## 2016-10-23 MED ORDER — MUPIROCIN 2 % EX OINT
1.0000 "application " | TOPICAL_OINTMENT | Freq: Two times a day (BID) | CUTANEOUS | Status: DC
  Administered 2016-10-23 – 2016-10-25 (×5): 1 via NASAL
  Filled 2016-10-23 (×2): qty 22

## 2016-10-23 MED ORDER — DOXERCALCIFEROL 4 MCG/2ML IV SOLN
INTRAVENOUS | Status: AC
  Filled 2016-10-23: qty 2

## 2016-10-23 MED ORDER — HYDROCOD POLST-CPM POLST ER 10-8 MG/5ML PO SUER
5.0000 mL | Freq: Two times a day (BID) | ORAL | Status: DC | PRN
  Administered 2016-10-23 – 2016-10-24 (×4): 5 mL via ORAL
  Filled 2016-10-23 (×4): qty 5

## 2016-10-23 MED ORDER — CHLORHEXIDINE GLUCONATE CLOTH 2 % EX PADS
6.0000 | MEDICATED_PAD | Freq: Every day | CUTANEOUS | Status: DC
  Administered 2016-10-23 – 2016-10-25 (×2): 6 via TOPICAL

## 2016-10-23 NOTE — Consult Note (Signed)
Referring Provider: No ref. provider found Primary Care Physician:  Glendon Axe, MD Primary Nephrologist:    Reason for Consultation:   Medical management of End Stage Renal Disease   HPI:81 y.o. male with medical history significant of OSA (recently started on CPAP), pulmonary HTN, HTN, HLD, remote Hep C, DM (diet controlled), and ESRD on MWF  Appears to have Right sided pneumonia and consolidation. CXF showed pleural effusions bilaterally and infiltrates  Hemoglobin low at 6.9 ( 7 last week)    Past Medical History:  Diagnosis Date  . Anemia    low iron  . Arthritis   . Asthma   . Chronic kidney disease    dialysis M/W/F  . Constipation   . Diabetes mellitus without complication ([place])    not on any medications  . GERD (gastroesophageal reflux disease)   . Headache   . Hepatitis    Hepatitis C   . High cholesterol   . Hypertension   . Pulmonary hypertension ([place])   . Shortness of breath dyspnea   . Sleep apnea    Got CPAP about 1 week ago, setting is five    Past Surgical History:  Procedure Laterality Date  . AV FISTULA PLACEMENT    . CATARACT EXTRACTION W/PHACO Left 03/17/2015   Procedure: Cancelled procedure;  Surgeon: Marylynn Pearson, MD;  Location: [facility];  Service: Ophthalmology;  Laterality: Left;  . COLONOSCOPY    . JOINT REPLACEMENT Left    knee  . PENILE PROSTHESIS IMPLANT      Prior to Admission medications   Medication Sig Start Date End Date Taking? Authorizing Provider  amLODipine (NORVASC) 10 MG tablet Take 10 mg by mouth daily.   Yes [provider]  calcium carbonate (TUMS EX) 750 MG chewable tablet Chew 1 tablet by mouth 3 (three) times daily.   Yes [provider]  carvedilol (COREG) 12.5 MG tablet Take 12.5 mg by mouth 2 (two) times daily.  07/30/16  Yes [provider]  cloNIDine (CATAPRES - DOSED IN MG/24 HR) 0.2 mg/24hr patch Place 0.2 mg onto the skin every Tuesday.    Yes [provider]  esomeprazole  (NEXIUM) 40 MG capsule Take 40 mg by mouth 2 (two) times daily before a meal.    Yes [provider]  levothyroxine (SYNTHROID, LEVOTHROID) 25 MCG tablet Take 25 mcg by mouth daily before breakfast. 08/27/16  Yes [provider]  losartan (COZAAR) 100 MG tablet Take 100 mg by mouth at bedtime.    Yes [provider]  lovastatin (MEVACOR) 20 MG tablet Take 20 mg by mouth daily with supper.    Yes [provider]  polyethylene glycol (MIRALAX / GLYCOLAX) packet Take 17 g by mouth daily as needed (constipation). Mix in 8 oz liquid and drink   Yes [provider]  risperiDONE (RISPERDAL) 0.5 MG tablet Take 1.5 mg by mouth at bedtime.   Yes [provider]  risperiDONE (RISPERDAL) 1 MG tablet Take 1 mg by mouth daily before breakfast.    Yes [provider]  sevelamer carbonate (RENVELA) 800 MG tablet Take 800 mg by mouth See admin instructions. Take 1 tablet (800 mg) by mouth three times daily with meals and 1 tablet (800 mg)  with snacks - up to 5 tablets daily   Yes [provider]    Current Facility-Administered Medications  Medication Dose Route Frequency Provider Last Rate Last Dose  . acetaminophen (TYLENOL) tablet 650 mg  650 mg Oral Q6H  PRN Karmen Bongo, MD       Or  . acetaminophen (TYLENOL) suppository 650 mg  650 mg Rectal Q6H PRN Karmen Bongo, MD      . albuterol (PROVENTIL) (2.5 MG/3ML) 0.083% nebulizer solution 2.5 mg  2.5 mg Nebulization Q2H PRN Karmen Bongo, MD      . aspirin EC tablet 81 mg  81 mg Oral Daily Karmen Bongo, MD   81 mg at 10/23/16 0908  . calcium carbonate (dosed in mg elemental calcium) suspension 500 mg of elemental calcium  500 mg of elemental calcium Oral Q6H PRN Karmen Bongo, MD      . calcium carbonate (TUMS - dosed in mg elemental calcium) chewable tablet 400 mg of elemental calcium  2 tablet Oral TID WC Karmen Bongo, MD   400 mg of elemental calcium at 10/23/16 0811  .  camphor-menthol (SARNA) lotion 1 application  1 application Topical Z6X PRN Karmen Bongo, MD       And  . hydrOXYzine (ATARAX/VISTARIL) tablet 25 mg  25 mg Oral Q8H PRN Karmen Bongo, MD      . carvedilol (COREG) tablet 12.5 mg  12.5 mg Oral BID WC Karmen Bongo, MD   12.5 mg at 10/23/16 0811  . Chlorhexidine Gluconate Cloth 2 % PADS 6 each  6 each Topical Q0600 Karmen Bongo, MD   6 each at 10/23/16 0631  . chlorpheniramine-HYDROcodone (TUSSIONEX) 10-8 MG/5ML suspension 5 mL  5 mL Oral Q12H PRN Schorr, Rhetta Mura, NP   5 mL at 10/23/16 0225  . [START ON 10/24/2016] cloNIDine (CATAPRES - Dosed in mg/24 hr) patch 0.2 mg  0.2 mg Transdermal Q Alverda Skeans, MD      . dextromethorphan-guaiFENesin [facility] DM) 30-600 MG per 12 hr tablet 1 tablet  1 tablet Oral BID Karmen Bongo, MD   1 tablet at 10/23/16 0908  . docusate sodium (ENEMEEZ) enema 283 mg  1 enema Rectal PRN Karmen Bongo, MD      . feeding supplement (NEPRO CARB STEADY) liquid 237 mL  237 mL Oral TID PRN Karmen Bongo, MD      . hydrALAZINE (APRESOLINE) injection 5 mg  5 mg Intravenous Q4H PRN Karmen Bongo, MD      . HYDROmorphone (DILAUDID) injection 0.5 mg  0.5 mg Intravenous Q4H PRN Karmen Bongo, MD   0.5 mg at 10/23/16 0960  . insulin aspart (novoLOG) injection 0-15 Units  0-15 Units Subcutaneous TID WC Karmen Bongo, MD      . levothyroxine (SYNTHROID, LEVOTHROID) tablet 25 mcg  25 mcg Oral QAC breakfast Karmen Bongo, MD   25 mcg at 10/23/16 [phone]  . losartan (COZAAR) tablet 100 mg  100 mg Oral QHS Karmen Bongo, MD   100 mg at 10/23/16 0001  . mupirocin ointment (BACTROBAN) 2 % 1 application  1 application Nasal BID Karmen Bongo, MD   1 application at 98/11/91 [phone]  . ondansetron (ZOFRAN) tablet 4 mg  4 mg Oral Q6H PRN Karmen Bongo, MD       Or  . ondansetron [facility]) injection 4 mg  4 mg Intravenous Q6H PRN Karmen Bongo, MD   4 mg at 10/23/16 1000  . pantoprazole (PROTONIX) EC tablet 40 mg   40 mg Oral Daily Karmen Bongo, MD   40 mg at 10/23/16 0908  . polyethylene glycol (MIRALAX / GLYCOLAX) packet 17 g  17 g Oral Daily PRN Karmen Bongo, MD      . polyethylene glycol (MIRALAX / GLYCOLAX) packet 17 g  17 g Oral Daily Karmen Bongo, MD   17 g at 10/23/16 2458  . pravastatin (PRAVACHOL) tablet 20 mg  20 mg Oral q1800 Karmen Bongo, MD      . risperiDONE (RISPERDAL) tablet 1 mg  1 mg Oral QAC breakfast Karmen Bongo, MD   1 mg at 10/23/16 0998  . risperiDONE (RISPERDAL) tablet 1.5 mg  1.5 mg Oral QHS Karmen Bongo, MD   1.5 mg at 10/23/16 0002  . sevelamer carbonate (RENVELA) tablet 800 mg  800 mg Oral TID WC Karmen Bongo, MD   800 mg at 10/23/16 0811  . sevelamer carbonate (RENVELA) tablet 800 mg  800 mg Oral BID PRN Karmen Bongo, MD      . sorbitol 70 % solution 30 mL  30 mL Oral PRN Karmen Bongo, MD      . zolpidem Lorrin Mais) tablet 5 mg  5 mg Oral QHS PRN Karmen Bongo, MD        Allergies as of 10/22/2016 - Review Complete 10/22/2016  Allergen Reaction Noted  . Ace inhibitors Other (See Comments) 08/11/2013  . Propofol Other (See Comments) 09/11/2016  . Sulfamethoxazole Hives 03/13/2015  . Ibuprofen Rash 03/16/2015  . Sulfa antibiotics Rash 03/16/2015  . Tetracycline Hives, Swelling, and Rash 03/13/2015    History reviewed. No pertinent family history.  Social History   Social History  . Marital status: Married    Spouse name: N/A  . Number of children: N/A  . Years of education: N/A   Occupational History  . retired    Social History Main Topics  . Smoking status: Former Smoker    Quit date: 03/15/1985  . Smokeless tobacco: Never Used  . Alcohol use No  . Drug use: No  . Sexual activity: Not on file   Other Topics Concern  . Not on file   Social History Narrative  . No narrative on file    Review of Systems: Gen: Denies any fever, chills, sweats, anorexia, fatigue, weakness, malaise, weight loss, and sleep disorder HEENT: No  visual complaints, No history of Retinopathy. Normal external appearance No Epistaxis or Sore throat. No sinusitis.  History of Retinopathy CV: Denies chest pain, angina, palpitations, syncope, orthopnea, PND, peripheral edema, and claudication. Resp: ++  dyspnea at rest,++ dyspnea with exercise, ++ cough,++ sputum, wheezing, no coughing up blood, and pleurisy. GI: Denies vomiting blood, jaundice, and fecal incontinence.   Denies dysphagia or odynophagia. GU : Denies urinary burning, blood in urine, urinary frequency, urinary hesitancy, nocturnal urination, and urinary incontinence.  No renal calculi. MS: Denies joint pain, limitation of movement, and swelling, stiffness, low back pain, extremity pain. Denies muscle weakness, cramps, atrophy.  No use of non steroidal antiinflammatory drugs. Derm: Denies rash, itching, dry skin, hives, moles, warts, or unhealing ulcers.  Psych: Denies depression, anxiety, memory loss, suicidal ideation, hallucinations, paranoia, and confusion. Heme: Denies bruising, bleeding, and enlarged lymph nodes. Neuro: No headache.  No diplopia. No dysarthria.  No dysphasia.  No history of CVA.  No Seizures. No paresthesias.  No weakness. Endocrine   DM.  No Thyroid disease.  No Adrenal disease.  Physical Exam: Vital signs in last 24 hours: Temp:  [97.8 F (36.6 C)-98.2 F (36.8 C)] 97.9 F (36.6 C) (07/23 0715) Pulse Rate:  [59-74] 72 (07/23 0715) Resp:  [13-35] 18 (07/23 0715) BP: (163-190)/(63-90) 190/90 (07/23 0715) SpO2:  [91 %-100 %] 100 % (07/23 0741) Weight:  [182 lb (82.6 kg)] 182 lb (82.6 kg) (07/22 1756) Last BM Date:  10/19/16 General:   Alert,  Well-developed, well-nourished, pleasant and cooperative in NAD Head:  Normocephalic and atraumatic. Eyes:  Sclera clear, no icterus.   Conjunctiva pink. Ears:  Normal auditory acuity. Nose:  No deformity, discharge,  or lesions. Mouth:  No deformity or lesions, dentition normal. Neck:  Supple; no masses or  thyromegaly. JVP not elevated Lungs:  Diminished breath sounds at bases . Heart:  Regular rate and rhythm; no murmurs, clicks, rubs,  or gallops. Abdomen:  Soft, nontender and nondistended. No masses, hepatosplenomegaly or hernias noted. Normal bowel sounds, without guarding, and without rebound.   Msk:  Symmetrical without gross deformities. Normal posture. Pulses:  No carotid, renal, femoral bruits. DP and PT symmetrical and equal Extremities:  Without clubbing or edema. AVF Left with Thrill  Neurologic:  Alert and  oriented x4;  grossly normal neurologically. Skin:  Intact without significant lesions or rashes. Cervical Nodes:  No significant cervical adenopathy. Psych:  Alert and cooperative. Normal mood and affect.  Intake/Output from previous day: 07/22 0701 - 07/23 0700 In: 575 [P.O.:240; Blood:335] Out: 0  Intake/Output this shift: Total I/O In: 882 [P.O.:420; Blood:462] Out: -   Lab Results:  Recent Labs  10/22/16 1827 10/22/16 2210 10/23/16 0747  WBC 6.7  --   --   HGB 6.9* 4.5* 9.1*  HCT 20.8* 13.9* 27.1*  PLT 196  --   --    BMET  Recent Labs  10/22/16 1827  NA 129*  K 4.3  CL 92*  CO2 24  GLUCOSE 131*  BUN 21*  CREATININE 8.58*  CALCIUM 9.3   LFT  Recent Labs  10/22/16 1827  PROT 7.1  ALBUMIN 3.6  AST 24  ALT 30  ALKPHOS 91  BILITOT 0.9   PT/INR No results for input(s): LABPROT, INR in the last 72 hours. Hepatitis Panel No results for input(s): HEPBSAG, HCVAB, HEPAIGM, HEPBIGM in the last 72 hours.  Studies/Results: Ct Abdomen Pelvis Wo Contrast  Result Date: 10/23/2016 CLINICAL DATA:  Sudden onset of abdominal pain. Decreased hemoglobin. EXAM: CT ABDOMEN AND PELVIS WITHOUT CONTRAST TECHNIQUE: Multidetector CT imaging of the abdomen and pelvis was performed following the standard protocol without IV contrast. COMPARISON:  CT 01/16/2015 FINDINGS: Lower chest: Bilateral pleural effusions, moderate on the right and small on the left. These  measure simple fluid density. Adjacent compressive atelectasis. There multi chamber cardiomegaly with mild septal thickening. Scattered basilar calcifications. Hepatobiliary: No evidence for focal lesion allowing for lack contrast. Gallbladder physiologically distended, no calcified stone. No biliary dilatation. Pancreas: No ductal dilatation or inflammation. Spleen: Normal in size without focal abnormality. Adrenals/Urinary Tract: Mild thickening on the left adrenal gland without discrete nodule. No right adrenal nodule. Bilateral renal cortical thinning. Left renal cyst. Small hyperdense cyst in the mid left kidney. Urinary bladder is nondistended. Mild bladder wall thickening. Stomach/Bowel: Stomach is physiologically distended. No small bowel dilatation or obstruction. There is mild fecalization of distal small bowel contents. Moderate to large stool in the cecum and ascending colon. Mild gaseous distention of transverse colon. Small volume of stool in the more distal colon. Descending and sigmoid colonic diverticulosis without acute inflammation. There is sigmoid colonic redundancy. Normal appendix. Vascular/Lymphatic: Aortic and branch atherosclerosis. No pathologically enlarged abdominal or pelvic lymph nodes. Reproductive: Penile prosthesis in place. Unremarkable prostate gland. Other: No free air, free fluid, or intra-abdominal fluid collection. No evidence of retroperitoneal hemorrhage. Mild presacral edema is chronic. Musculoskeletal: There are no acute or suspicious osseous abnormalities. Bilateral intramuscular gluteal calcifications are  unchanged. IMPRESSION: 1. No findings to suggest intra-abdominal hemorrhage. 2. Colonic diverticulosis without diverticulitis. Colonic redundancy with moderate proximal stool burden, can be seen with constipation. No acute bowel inflammation. 3. Moderate right and small left pleural effusion. Cardiomegaly with septal thickening, findings consistent with CHF. 4.  Aortic  Atherosclerosis (ICD10-I70.0). Electronically Signed   By: Jeb Levering M.D.   On: 10/23/2016 00:54   Dg Chest 2 View  Result Date: 10/22/2016 CLINICAL DATA:  Chest pain shortness of breath EXAM: CHEST  2 VIEW COMPARISON:  09/11/2016 FINDINGS: Elevation of the right diaphragm. Small bilateral pleural effusions. Bibasilar atelectasis or mild infiltrates. Borderline cardiomegaly with mild central congestion. No pneumothorax. IMPRESSION: 1. Small bilateral pleural effusions with bibasilar atelectasis or infiltrates 2. Borderline cardiomegaly with central vascular congestion. Electronically Signed   By: Donavan Foil M.D.   On: 10/22/2016 18:52    Assessment/Plan:  ESRD-MWF dialysis [place]  Will continue regimen today  ANEMIA- Will continue to follow  It appears that the patient will need transfusion and ESA administration   MBD- Will follow  -- binders  Vitamin D and Calcimimetics as needed  HTN/VOL- Consistent with marked volume overload continue to challenge EDW   ACCESS- AVF     LOS: 1 [place] W $RemoveBefor'@[email or web address]'[email or web address]$ '@12'$ :14 PM

## 2016-10-23 NOTE — Progress Notes (Signed)
New Admission Note: Late Entry  Arrival Method: By bed from ED, around 2250 Mental Orientation: Alert and oriented Telemetry: [address], CCMD notified Assessment: Completed Skin: Completed, refer to flowsheets IV: Right hand Pain: Denies Tubes: None Safety Measures: Safety Fall Prevention Plan was given, discussed and signed. Admission: Completed [address] Orientation: Patient has been orientated to the room, unit and the staff. Family: None  Orders have been reviewed and implemented. Will continue to monitor the patient. Call light has been placed within reach and bed alarm has been activated.   Perry Mount, RN  Phone Number: [phone]

## 2016-10-23 NOTE — Progress Notes (Signed)
PROGRESS NOTE    Anthony Davis  DQE:[id number] DOB: [birth year] DOA: 10/22/2016 PCP: Caffie Damme, MD    Brief Narrative:  82 y.o. male with medical history significant of OSA (recently started on CPAP), pulmonary HTN, HTN, HLD, remote Hep C, DM (diet controlled), and ESRD on MWF HD presenting with cough.  Patient has been gagging with nothing coming up for 3 days.  Abdominal pain for coughing.  Coughed yesterday all day long. +SOB. Nonproductive cough other than white sputum a couple of times.  +sick contacts at HD.  No fever.  No chills.  +abdominal pain, mid-umbilical.  He saw blood in his stools a couple of months ago, "straining and bust a vessel", bright red blood.  No dark stools now.  Last BM was about 3 days ago, which is normal for him and sometimes even longer.  +weak/tired quite a while, maybe 2-3 weeks. Has required a cane recently and had falls recently  Assessment & Plan:   Principal Problem:   Acute on chronic respiratory failure with hypoxia (HCC) Active Problems:   CHF exacerbation (HCC)   ESRD on dialysis (HCC)   Diabetes mellitus type 2, diet-controlled (HCC)   Anemia due to chronic kidney disease   OSA on CPAP  Acute on chronic respiratory failure -Patient with a multitude of chronic respiratory issues including pulmonary HTN, h/o tobacco dependence, and OSA on CPAP -Presenting with nonproductive cough, SOB -Imaging appears to indicate that this is due to volume overload (see below) -He was hypoxic on arrival with O2 level of 91% on RA -Patient had undergone HD today  Anemia with abdominal pain -Hgb 6.9  -Given 2 units PRBC's overnight with post-transfustion hgb of 9.1 -No obvious signs of acute blood loss -Will repeat CBC, continue to transfuse as needed -Patient does have long hx of significant constipation with large stool burden seen on CT per my own read. Will anticipate underlying hemorrhoids, thus likely heme pos stools  ESRD on HD -BUN 21/Creatinine  8.58/GFR 5; 76/11.7/5 on 6/12 -MWF HD in [facility] -Nephrology consulted. Patient underwent HD on 7/23  DM -Glucose 131, 152 -Reports that he no longer has diabetes -His last A1c was 4.6 -Cont on SSI coverage  Constipation -Large stool burden noted on imaging -Will give cathartics until resolution of constipation  DVT prophylaxis: SCD's Code Status: Full Family Communication: Pt in room, family not at bedside Disposition Plan: Possible   Consultants:   Nephrology  Procedures:     Antimicrobials: Anti-infectives    Start     Dose/Rate Route Frequency Ordered Stop   10/23/16 2000  ceFEPIme (MAXIPIME) 2 g in dextrose 5 % 50 mL IVPB  Status:  Discontinued     2 g 100 mL/hr over 30 Minutes Intravenous Every M-W-F (2000) 10/22/16 2237 10/23/16 0130   10/23/16 1200  vancomycin (VANCOCIN) IVPB 1000 mg/200 mL premix  Status:  Discontinued     1,000 mg 200 mL/hr over 60 Minutes Intravenous Every M-W-F (Hemodialysis) 10/22/16 1934 10/23/16 0130   10/22/16 2015  vancomycin (VANCOCIN) 1,500 mg in sodium chloride 0.9 % 250 mL IVPB     1,500 mg 250 mL/hr over 60 Minutes Intravenous  Once 10/22/16 1934 10/22/16 2211   10/22/16 1930  ceFEPIme (MAXIPIME) 1 g in dextrose 5 % 50 mL IVPB     1 g 100 mL/hr over 30 Minutes Intravenous  Once 10/22/16 1923 10/22/16 2100       Subjective: Complains of constipation  Objective: Vitals:   10/23/16 1430  10/23/16 1500 10/23/16 1626 10/23/16 1705  BP: (!) 174/86 (!) 165/76 (!) 171/83 (!) 171/71  Pulse: 76 76 72 74  Resp: $Remo'15 13 19 20  'EXpIC$ Temp:   97.6 F (36.4 C) 97.9 F (36.6 C)  TempSrc:   Oral Oral  SpO2: 98% 98% 98% 100%  Weight:   85.6 kg (188 lb 11.4 oz)   Height:        Intake/Output Summary (Last 24 hours) at 10/23/16 1805 Last data filed at 10/23/16 1727  Gross per 24 hour  Intake             1697 ml  Output             4850 ml  Net            -3153 ml   Filed Weights   10/22/16 1756 10/23/16 1217 10/23/16 1626  Weight:  82.6 kg (182 lb) 89.6 kg (197 lb 8.5 oz) 85.6 kg (188 lb 11.4 oz)    Examination: General exam: Appears calm and comfortable  Respiratory system: Clear to auscultation. Respiratory effort normal. Cardiovascular system: S1 & S2 heard, RRR.  Gastrointestinal system: Abdomen mildly distended, decreased BS Central nervous system: Alert and oriented. No focal neurological deficits. Extremities: Symmetric 5 x 5 power. Skin: No rashes, lesions  Psychiatry: Judgement and insight appear normal. Mood & affect appropriate.   Data Reviewed: I have personally reviewed following labs and imaging studies  CBC:  Recent Labs Lab 10/22/16 1827 10/22/16 2210 10/23/16 0747 10/23/16 1240  WBC 6.7  --   --  5.1  NEUTROABS 5.0  --   --   --   HGB 6.9* 4.5* 9.1* 9.1*  HCT 20.8* 13.9* 27.1* 26.3*  MCV 93.7  --   --  90.7  PLT 196  --   --  314*   Basic Metabolic Panel:  Recent Labs Lab 10/22/16 1827 10/23/16 1240  NA 129* 126*  K 4.3 3.8  CL 92* 92*  CO2 24 27  GLUCOSE 131* 125*  BUN 21* 20  CREATININE 8.58* 8.09*  CALCIUM 9.3 8.8*  PHOS  --  2.5   GFR: Estimated Creatinine Clearance: 7.6 mL/min (A) (by C-G formula based on SCr of 8.09 mg/dL (H)). Liver Function Tests:  Recent Labs Lab 10/22/16 1827 10/23/16 1240  AST 24  --   ALT 30  --   ALKPHOS 91  --   BILITOT 0.9  --   PROT 7.1  --   ALBUMIN 3.6 3.4*    Recent Labs Lab 10/22/16 1827  LIPASE 24   No results for input(s): AMMONIA in the last 168 hours. Coagulation Profile: No results for input(s): INR, PROTIME in the last 168 hours. Cardiac Enzymes:  Recent Labs Lab 10/22/16 1827  TROPONINI 0.03*   BNP (last 3 results) No results for input(s): PROBNP in the last 8760 hours. HbA1C: No results for input(s): HGBA1C in the last 72 hours. CBG:  Recent Labs Lab 10/22/16 2242 10/23/16 0754 10/23/16 1147 10/23/16 1708  GLUCAP 152* 102* 151* 173*   Lipid Profile: No results for input(s): CHOL, HDL,  LDLCALC, TRIG, CHOLHDL, LDLDIRECT in the last 72 hours. Thyroid Function Tests: No results for input(s): TSH, T4TOTAL, FREET4, T3FREE, THYROIDAB in the last 72 hours. Anemia Panel: No results for input(s): VITAMINB12, FOLATE, FERRITIN, TIBC, IRON, RETICCTPCT in the last 72 hours. Sepsis Labs: No results for input(s): PROCALCITON, LATICACIDVEN in the last 168 hours.  Recent Results (from the past 240 hour(s))  Culture,  blood (routine x 2) Call MD if unable to obtain prior to antibiotics being given     Status: None (Preliminary result)   Collection Time: 10/22/16 10:15 PM  Result Value Ref Range Status   Specimen Description BLOOD RIGHT FOOT  Final   Special Requests   Final    BOTTLES DRAWN AEROBIC AND ANAEROBIC Blood Culture results may not be optimal due to an inadequate volume of blood received in culture bottles   Culture NO GROWTH < 24 HOURS  Final   Report Status PENDING  Incomplete  MRSA PCR Screening     Status: Abnormal   Collection Time: 10/23/16 12:07 AM  Result Value Ref Range Status   MRSA by PCR POSITIVE (A) NEGATIVE Final    Comment:        The GeneXpert MRSA Assay (FDA approved for NASAL specimens only), is one component of a comprehensive MRSA colonization surveillance program. It is not intended to diagnose MRSA infection nor to guide or monitor treatment for MRSA infections. RESULT CALLED TO, READ BACK BY AND VERIFIED WITH: RN Signe Colt [phone] $RemoveBeforeD'@0415'[email or web address]$  [facility]      Radiology Studies: Ct Abdomen Pelvis Wo Contrast  Result Date: 10/23/2016 CLINICAL DATA:  Sudden onset of abdominal pain. Decreased hemoglobin. EXAM: CT ABDOMEN AND PELVIS WITHOUT CONTRAST TECHNIQUE: Multidetector CT imaging of the abdomen and pelvis was performed following the standard protocol without IV contrast. COMPARISON:  CT 01/16/2015 FINDINGS: Lower chest: Bilateral pleural effusions, moderate on the right and small on the left. These measure simple fluid density. Adjacent compressive  atelectasis. There multi chamber cardiomegaly with mild septal thickening. Scattered basilar calcifications. Hepatobiliary: No evidence for focal lesion allowing for lack contrast. Gallbladder physiologically distended, no calcified stone. No biliary dilatation. Pancreas: No ductal dilatation or inflammation. Spleen: Normal in size without focal abnormality. Adrenals/Urinary Tract: Mild thickening on the left adrenal gland without discrete nodule. No right adrenal nodule. Bilateral renal cortical thinning. Left renal cyst. Small hyperdense cyst in the mid left kidney. Urinary bladder is nondistended. Mild bladder wall thickening. Stomach/Bowel: Stomach is physiologically distended. No small bowel dilatation or obstruction. There is mild fecalization of distal small bowel contents. Moderate to large stool in the cecum and ascending colon. Mild gaseous distention of transverse colon. Small volume of stool in the more distal colon. Descending and sigmoid colonic diverticulosis without acute inflammation. There is sigmoid colonic redundancy. Normal appendix. Vascular/Lymphatic: Aortic and branch atherosclerosis. No pathologically enlarged abdominal or pelvic lymph nodes. Reproductive: Penile prosthesis in place. Unremarkable prostate gland. Other: No free air, free fluid, or intra-abdominal fluid collection. No evidence of retroperitoneal hemorrhage. Mild presacral edema is chronic. Musculoskeletal: There are no acute or suspicious osseous abnormalities. Bilateral intramuscular gluteal calcifications are unchanged. IMPRESSION: 1. No findings to suggest intra-abdominal hemorrhage. 2. Colonic diverticulosis without diverticulitis. Colonic redundancy with moderate proximal stool burden, can be seen with constipation. No acute bowel inflammation. 3. Moderate right and small left pleural effusion. Cardiomegaly with septal thickening, findings consistent with CHF. 4.  Aortic Atherosclerosis (ICD10-I70.0). Electronically  Signed   By: Jeb Levering M.D.   On: 10/23/2016 00:54   Dg Chest 2 View  Result Date: 10/22/2016 CLINICAL DATA:  Chest pain shortness of breath EXAM: CHEST  2 VIEW COMPARISON:  09/11/2016 FINDINGS: Elevation of the right diaphragm. Small bilateral pleural effusions. Bibasilar atelectasis or mild infiltrates. Borderline cardiomegaly with mild central congestion. No pneumothorax. IMPRESSION: 1. Small bilateral pleural effusions with bibasilar atelectasis or infiltrates 2. Borderline cardiomegaly with central vascular congestion. Electronically  Signed   By: Donavan Foil M.D.   On: 10/22/2016 18:52    Scheduled Meds: . aspirin EC  81 mg Oral Daily  . calcium carbonate  2 tablet Oral TID WC  . carvedilol  12.5 mg Oral BID WC  . Chlorhexidine Gluconate Cloth  6 each Topical Q0600  . [START ON 10/24/2016] cloNIDine  0.2 mg Transdermal Q Tue  . dextromethorphan-guaiFENesin  1 tablet Oral BID  . doxercalciferol  4 mcg Intravenous Q M,W,F-HD  . insulin aspart  0-15 Units Subcutaneous TID WC  . levothyroxine  25 mcg Oral QAC breakfast  . losartan  100 mg Oral QHS  . mupirocin ointment  1 application Nasal BID  . pantoprazole  40 mg Oral Daily  . polyethylene glycol  17 g Oral Daily  . pravastatin  20 mg Oral q1800  . risperiDONE  1 mg Oral QAC breakfast  . risperiDONE  1.5 mg Oral QHS  . sevelamer carbonate  800 mg Oral TID WC   Continuous Infusions:   LOS: 1 day   Marin Wisner, Orpah Melter, MD [facility] Pager [phone]  If 7PM-7AM, please contact night-coverage [email or web address] Password [facility] 10/23/2016, 6:05 PM

## 2016-10-24 LAB — CBC
HEMATOCRIT: 28.5 % — AB (ref 39.0–52.0)
Hemoglobin: 9.5 g/dL — ABNORMAL LOW (ref 13.0–17.0)
MCH: 31.1 pg (ref 26.0–34.0)
MCHC: 33.3 g/dL (ref 30.0–36.0)
MCV: 93.4 fL (ref 78.0–100.0)
Platelets: 162 10*3/uL (ref 150–400)
RBC: 3.05 MIL/uL — ABNORMAL LOW (ref 4.22–5.81)
RDW: 15.7 % — AB (ref 11.5–15.5)
WBC: 5.8 10*3/uL (ref 4.0–10.5)

## 2016-10-24 LAB — BPAM RBC
BLOOD PRODUCT EXPIRATION DATE: 201808212359
Blood Product Expiration Date: 201808212359
ISSUE DATE / TIME: 201807222301
ISSUE DATE / TIME: 201807230300
UNIT TYPE AND RH: 5100
UNIT TYPE AND RH: 5100

## 2016-10-24 LAB — TYPE AND SCREEN
ABO/RH(D): O POS
ANTIBODY SCREEN: NEGATIVE
UNIT DIVISION: 0
UNIT DIVISION: 0

## 2016-10-24 LAB — GLUCOSE, CAPILLARY
GLUCOSE-CAPILLARY: 127 mg/dL — AB (ref 65–99)
Glucose-Capillary: 114 mg/dL — ABNORMAL HIGH (ref 65–99)
Glucose-Capillary: 72 mg/dL (ref 65–99)
Glucose-Capillary: 101 mg/dL — ABNORMAL HIGH (ref 65–99)

## 2016-10-24 LAB — HEMOGLOBIN A1C
Hgb A1c MFr Bld: 4.9 % (ref 4.8–5.6)
Mean Plasma Glucose: 94 mg/dL

## 2016-10-24 LAB — HEPATITIS B SURFACE ANTIGEN: HEP B S AG: NEGATIVE

## 2016-10-24 MED ORDER — ORAL CARE MOUTH RINSE: 15.0000 mL | Freq: Two times a day (BID) | OROMUCOSAL | Status: DC

## 2016-10-24 MED ORDER — SORBITOL 70 % SOLN
960.0000 mL | TOPICAL_OIL | Freq: Once | ORAL | Status: AC
  Administered 2016-10-24: 960 mL via RECTAL
  Filled 2016-10-24: qty 240

## 2016-10-24 NOTE — Progress Notes (Signed)
Patient has home CPAP and will place self on when ready. RT will continue to monitor.

## 2016-10-24 NOTE — Evaluation (Signed)
Physical Therapy Evaluation Patient Details Name: Anthony Davis MRN: [id number] DOB: 1935-01-20 Today's Date: 10/24/2016   History of Present Illness  82 yo male with onset of HCAP and acute on chronic resp failure was admitted, had GI bleed and transfused.  PMHx:  HTN, DM, Hep C, OSA, HD, pulm HTN,   Clinical Impression  Pt was up to walk with a drop in O2 sats on 2L O2.  His starting O2 sat was 97% and after walk was 93%, pulses stable in 73 to 76b/min after activity.   Has been on chronic O2 use in ALF and will likely need to use more often.  He is also in need of RW with a HHPT consult requested to monitor his vitals and train strength and endurance.   Will follow acutely as needed for same.    Follow Up Recommendations Home health PT;Supervision for mobility/OOB    Equipment Recommendations  Rolling walker with 5" wheels    Recommendations for Other Services       Precautions / Restrictions Precautions Precautions: Fall (telemetry) Restrictions Weight Bearing Restrictions: No      Mobility  Bed Mobility Overal bed mobility: Modified Independent                Transfers Overall transfer level: Needs assistance Equipment used: Rolling walker (2 wheeled);1 person hand held assist Transfers: Sit to/from Stand Sit to Stand: Min guard         General transfer comment: reminders for hand placement and for reaching to sit back down, stood impulsively at the bed  Ambulation/Gait Ambulation/Gait assistance: Min guard;Min assist Ambulation Distance (Feet): 30 Feet Assistive device: Rolling walker (2 wheeled);1 person hand held assist Gait Pattern/deviations: Step-through pattern;Step-to pattern;Trunk flexed;Wide base of support;Shuffle;Decreased stride length Gait velocity: reduced Gait velocity interpretation: Below normal speed for age/gender General Gait Details: tends to need extra time for turns and minor help to turn and navigate confined spaces  Stairs             Wheelchair Mobility    Modified Rankin (Stroke Patients Only)       Balance Overall balance assessment: Needs assistance Sitting-balance support: Feet supported Sitting balance-Leahy Scale: Good     Standing balance support: Bilateral upper extremity supported Standing balance-Leahy Scale: Fair Standing balance comment: less than fair balance dynamically                             Pertinent Vitals/Pain Pain Assessment: No/denies pain    Home Living Family/patient expects to be discharged to:: Assisted living Living Arrangements: Spouse/significant other             Home Equipment: Cane - single point;Shower seat - built in;Grab bars - toilet;Grab bars - tub/shower      Prior Function Level of Independence: Needs assistance   Gait / Transfers Assistance Needed: SPC for gait in facility  ADL's / Homemaking Assistance Needed: ALF staff for assisting with housework and cooking,         Hand Dominance   Dominant Hand: Right    Extremity/Trunk Assessment   Upper Extremity Assessment Upper Extremity Assessment: Overall WFL for tasks assessed    Lower Extremity Assessment Lower Extremity Assessment: Generalized weakness    Cervical / Trunk Assessment Cervical / Trunk Assessment: Normal  Communication   Communication: HOH  Cognition Arousal/Alertness: Lethargic;Awake/alert Behavior During Therapy: WFL for tasks assessed/performed Overall Cognitive Status: No family/caregiver present to determine baseline cognitive functioning  General Comments: repetitive information from pt, HOH and asking questions repetitively      General Comments      Exercises     Assessment/Plan    PT Assessment Patient needs continued PT services  PT Problem List Decreased strength;Decreased range of motion;Decreased activity tolerance;Decreased balance;Decreased mobility;Decreased coordination;Decreased  cognition;Decreased knowledge of use of DME;Decreased safety awareness;Cardiopulmonary status limiting activity       PT Treatment Interventions DME instruction;Gait training;Functional mobility training;Therapeutic activities;Therapeutic exercise;Balance training;Neuromuscular re-education;Cognitive remediation;Patient/family education    PT Goals (Current goals can be found in the Care Plan section)  Acute Rehab PT Goals Patient Stated Goal: to avoid overdoing activity PT Goal Formulation: With patient Time For Goal Achievement: 11/07/16 Potential to Achieve Goals: Good    Frequency Min 3X/week   Barriers to discharge Other (comment) (ALF may not be able to provide hands on help)      Co-evaluation               AM-PAC PT "6 Clicks" Daily Activity  Outcome Measure Difficulty turning over in bed (including adjusting bedclothes, sheets and blankets)?: A Little Difficulty moving from lying on back to sitting on the side of the bed? : A Little Difficulty sitting down on and standing up from a chair with arms (e.g., wheelchair, bedside commode, etc,.)?: Total Help needed moving to and from a bed to chair (including a wheelchair)?: A Little Help needed walking in hospital room?: A Little Help needed climbing 3-5 steps with a railing? : A Lot 6 Click Score: 15    End of Session Equipment Utilized During Treatment: Gait belt;Oxygen Activity Tolerance: Patient limited by fatigue Patient left: in bed;with call bell/phone within reach;with nursing/sitter in room Nurse Communication: Mobility status PT Visit Diagnosis: Unsteadiness on feet (R26.81);Muscle weakness (generalized) (M62.81);History of falling (Z91.81);Difficulty in walking, not elsewhere classified (R26.2)    Time: 1015-1029 (1056 to 1124) PT Time Calculation (min) (ACUTE ONLY): 14 min   Charges:   PT Evaluation $PT Eval Low Complexity: 1 Procedure PT Treatments $Gait Training: 8-22 mins $Therapeutic Exercise:  8-22 mins   PT G Codes:   PT G-Codes **NOT FOR INPATIENT CLASS** Functional Assessment Tool Used: AM-PAC 6 Clicks Basic Mobility    Ramond Dial 10/24/2016, 11:46 AM   Mee Hives, PT MS Acute Rehab Dept. Number: [place]

## 2016-10-24 NOTE — Progress Notes (Signed)
Patient has home CPAP and can place on when ready. RT will continue to monitor.

## 2016-10-24 NOTE — Progress Notes (Addendum)
PROGRESS NOTE    Kristen Bushway  LXB:[id number] DOB: 27-Jul-1934 DOA: 10/22/2016 PCP: Caffie Damme, MD    Brief Narrative:  82 y.o. male with medical history significant of OSA (recently started on CPAP), pulmonary HTN, HTN, HLD, remote Hep C, DM (diet controlled), and ESRD on MWF HD presenting with cough.  Patient has been gagging with nothing coming up for 3 days.  Abdominal pain for coughing.  Coughed yesterday all day long. +SOB. Nonproductive cough other than white sputum a couple of times.  +sick contacts at HD.  No fever.  No chills.  +abdominal pain, mid-umbilical.  He saw blood in his stools a couple of months ago, "straining and bust a vessel", bright red blood.  No dark stools now.  Last BM was about 3 days ago, which is normal for him and sometimes even longer.  +weak/tired quite a while, maybe 2-3 weeks. Has required a cane recently and had falls recently  Assessment & Plan:   Principal Problem:   Acute on chronic respiratory failure with hypoxia (HCC) Active Problems:   CHF exacerbation (HCC)   ESRD on dialysis (HCC)   Diabetes mellitus type 2, diet-controlled (HCC)   Anemia due to chronic kidney disease   OSA on CPAP  Acute on chronic respiratory failure -Patient with a multitude of chronic respiratory issues including pulmonary HTN, h/o tobacco dependence, and OSA on CPAP -Presenting with nonproductive cough, SOB -Imaging appears to indicate that this is due to volume overload (see below) -He was hypoxic on arrival with O2 level of 91% on RA -overall stable at present. Discussed with Dr. Hyman Hopes. Plan to cont to address volume overload, plan HD tomorrow, per below  Anemia with abdominal pain -Hgb 6.9  -Given 2 units PRBC's overnight with post-transfustion hgb of 9.1 -No obvious signs of acute blood loss -Will repeat CBC, continue to transfuse as needed -Patient does have long hx of significant constipation with large stool burden seen on CT per my own read. Will  anticipate underlying hemorrhoids, thus likely heme pos stools  -hgb stable, now rising  ESRD on HD -BUN 21/Creatinine 8.58/GFR 5; 76/11.7/5 on 6/12 -MWF HD in [facility] -Nephrology consulted. Patient underwent HD on 7/23 -Per Nephrology, still clinically volume overloaded, plan HD tomorrow  DM -Glucose 131, 152 -Reports that he no longer has diabetes -His last A1c was 4.6 -Cont on SSI coverage -Stable at present  Constipation -Large stool burden noted on imaging -results with multiple enemas/cathartics -continue to promote bowel movement  Acute on chronic diastolic chf  DVT prophylaxis: SCD's Code Status: Full Family Communication: Pt in room, family not at bedside Disposition Plan: Possible d/c in 24hrs  Consultants:   Nephrology  Procedures:     Antimicrobials: Anti-infectives    Start     Dose/Rate Route Frequency Ordered Stop   10/23/16 2000  ceFEPIme (MAXIPIME) 2 g in dextrose 5 % 50 mL IVPB  Status:  Discontinued     2 g 100 mL/hr over 30 Minutes Intravenous Every M-W-F (2000) 10/22/16 2237 10/23/16 0130   10/23/16 1200  vancomycin (VANCOCIN) IVPB 1000 mg/200 mL premix  Status:  Discontinued     1,000 mg 200 mL/hr over 60 Minutes Intravenous Every M-W-F (Hemodialysis) 10/22/16 1934 10/23/16 0130   10/22/16 2015  vancomycin (VANCOCIN) 1,500 mg in sodium chloride 0.9 % 250 mL IVPB     1,500 mg 250 mL/hr over 60 Minutes Intravenous  Once 10/22/16 1934 10/22/16 2211   10/22/16 1930  ceFEPIme (MAXIPIME) 1 g  in dextrose 5 % 50 mL IVPB     1 g 100 mL/hr over 30 Minutes Intravenous  Once 10/22/16 1923 10/22/16 2100      Subjective: Reports feeling better after BM  Objective: Vitals:   10/23/16 2301 10/24/16 0501 10/24/16 0930 10/24/16 1652  BP:  (!) 177/79 (!) 169/76 (!) 155/68  Pulse:  68 73 (!) 57  Resp:   18 17  Temp:  98.6 F (37 C) (!) 97.4 F (36.3 C) 97.6 F (36.4 C)  TempSrc:   Oral Oral  SpO2:  92% 97% 95%  Weight: 86 kg (189 lb 11.2 oz)      Height:        Intake/Output Summary (Last 24 hours) at 10/24/16 1839 Last data filed at 10/24/16 1805  Gross per 24 hour  Intake             1402 ml  Output                0 ml  Net             1402 ml   Filed Weights   10/23/16 1626 10/23/16 2055 10/23/16 2301  Weight: 85.6 kg (188 lb 11.4 oz) 85.6 kg (188 lb 11.4 oz) 86 kg (189 lb 11.2 oz)    Examination: General exam: Awake, laying in bed, in nad Respiratory system: Normal respiratory effort, no wheezing Cardiovascular system: regular rate, s1, s2 Gastrointestinal system: Soft, nondistended, positive BS Central nervous system: CN2-12 grossly intact, strength intact Extremities: Perfused, no clubbing Skin: Normal skin turgor, no notable skin lesions seen Psychiatry: Mood normal // no visual hallucinations    Data Reviewed: I have personally reviewed following labs and imaging studies  CBC:  Recent Labs Lab 10/22/16 1827 10/22/16 2210 10/23/16 0747 10/23/16 1240 10/24/16 0508  WBC 6.7  --   --  5.1 5.8  NEUTROABS 5.0  --   --   --   --   HGB 6.9* 4.5* 9.1* 9.1* 9.5*  HCT 20.8* 13.9* 27.1* 26.3* 28.5*  MCV 93.7  --   --  90.7 93.4  PLT 196  --   --  137* 921   Basic Metabolic Panel:  Recent Labs Lab 10/22/16 1827 10/23/16 1240  NA 129* 126*  K 4.3 3.8  CL 92* 92*  CO2 24 27  GLUCOSE 131* 125*  BUN 21* 20  CREATININE 8.58* 8.09*  CALCIUM 9.3 8.8*  PHOS  --  2.5   GFR: Estimated Creatinine Clearance: 7.6 mL/min (A) (by C-G formula based on SCr of 8.09 mg/dL (H)). Liver Function Tests:  Recent Labs Lab 10/22/16 1827 10/23/16 1240  AST 24  --   ALT 30  --   ALKPHOS 91  --   BILITOT 0.9  --   PROT 7.1  --   ALBUMIN 3.6 3.4*    Recent Labs Lab 10/22/16 1827  LIPASE 24   No results for input(s): AMMONIA in the last 168 hours. Coagulation Profile: No results for input(s): INR, PROTIME in the last 168 hours. Cardiac Enzymes:  Recent Labs Lab 10/22/16 1827  TROPONINI 0.03*   BNP  (last 3 results) No results for input(s): PROBNP in the last 8760 hours. HbA1C:  Recent Labs  10/22/16 2316  HGBA1C 4.9   CBG:  Recent Labs Lab 10/23/16 1708 10/23/16 2053 10/24/16 0815 10/24/16 1209 10/24/16 1650  GLUCAP 173* 74 114* 127* 72   Lipid Profile: No results for input(s): CHOL, HDL, LDLCALC, TRIG, CHOLHDL, LDLDIRECT  in the last 72 hours. Thyroid Function Tests: No results for input(s): TSH, T4TOTAL, FREET4, T3FREE, THYROIDAB in the last 72 hours. Anemia Panel: No results for input(s): VITAMINB12, FOLATE, FERRITIN, TIBC, IRON, RETICCTPCT in the last 72 hours. Sepsis Labs: No results for input(s): PROCALCITON, LATICACIDVEN in the last 168 hours.  Recent Results (from the past 240 hour(s))  Culture, blood (routine x 2) Call MD if unable to obtain prior to antibiotics being given     Status: None (Preliminary result)   Collection Time: 10/22/16 10:00 PM  Result Value Ref Range Status   Specimen Description BLOOD RIGHT FOOT  Final   Special Requests   Final    BOTTLES DRAWN AEROBIC AND ANAEROBIC Blood Culture results may not be optimal due to an inadequate volume of blood received in culture bottles   Culture NO GROWTH 1 DAY  Final   Report Status PENDING  Incomplete  Culture, blood (routine x 2) Call MD if unable to obtain prior to antibiotics being given     Status: None (Preliminary result)   Collection Time: 10/22/16 10:15 PM  Result Value Ref Range Status   Specimen Description BLOOD RIGHT FOOT  Final   Special Requests   Final    BOTTLES DRAWN AEROBIC AND ANAEROBIC Blood Culture results may not be optimal due to an inadequate volume of blood received in culture bottles   Culture NO GROWTH 2 DAYS  Final   Report Status PENDING  Incomplete  MRSA PCR Screening     Status: Abnormal   Collection Time: 10/23/16 12:07 AM  Result Value Ref Range Status   MRSA by PCR POSITIVE (A) NEGATIVE Final    Comment:        The GeneXpert MRSA Assay (FDA approved for NASAL  specimens only), is one component of a comprehensive MRSA colonization surveillance program. It is not intended to diagnose MRSA infection nor to guide or monitor treatment for MRSA infections. RESULT CALLED TO, READ BACK BY AND VERIFIED WITH: RN Signe Colt [phone] @0415  [facility]      Radiology Studies: Ct Abdomen Pelvis Wo Contrast  Result Date: 10/23/2016 CLINICAL DATA:  Sudden onset of abdominal pain. Decreased hemoglobin. EXAM: CT ABDOMEN AND PELVIS WITHOUT CONTRAST TECHNIQUE: Multidetector CT imaging of the abdomen and pelvis was performed following the standard protocol without IV contrast. COMPARISON:  CT 01/16/2015 FINDINGS: Lower chest: Bilateral pleural effusions, moderate on the right and small on the left. These measure simple fluid density. Adjacent compressive atelectasis. There multi chamber cardiomegaly with mild septal thickening. Scattered basilar calcifications. Hepatobiliary: No evidence for focal lesion allowing for lack contrast. Gallbladder physiologically distended, no calcified stone. No biliary dilatation. Pancreas: No ductal dilatation or inflammation. Spleen: Normal in size without focal abnormality. Adrenals/Urinary Tract: Mild thickening on the left adrenal gland without discrete nodule. No right adrenal nodule. Bilateral renal cortical thinning. Left renal cyst. Small hyperdense cyst in the mid left kidney. Urinary bladder is nondistended. Mild bladder wall thickening. Stomach/Bowel: Stomach is physiologically distended. No small bowel dilatation or obstruction. There is mild fecalization of distal small bowel contents. Moderate to large stool in the cecum and ascending colon. Mild gaseous distention of transverse colon. Small volume of stool in the more distal colon. Descending and sigmoid colonic diverticulosis without acute inflammation. There is sigmoid colonic redundancy. Normal appendix. Vascular/Lymphatic: Aortic and branch atherosclerosis. No pathologically enlarged  abdominal or pelvic lymph nodes. Reproductive: Penile prosthesis in place. Unremarkable prostate gland. Other: No free air, free fluid, or intra-abdominal fluid collection.  No evidence of retroperitoneal hemorrhage. Mild presacral edema is chronic. Musculoskeletal: There are no acute or suspicious osseous abnormalities. Bilateral intramuscular gluteal calcifications are unchanged. IMPRESSION: 1. No findings to suggest intra-abdominal hemorrhage. 2. Colonic diverticulosis without diverticulitis. Colonic redundancy with moderate proximal stool burden, can be seen with constipation. No acute bowel inflammation. 3. Moderate right and small left pleural effusion. Cardiomegaly with septal thickening, findings consistent with CHF. 4.  Aortic Atherosclerosis (ICD10-I70.0). Electronically Signed   By: Jeb Levering M.D.   On: 10/23/2016 00:54   Dg Chest 2 View  Result Date: 10/22/2016 CLINICAL DATA:  Chest pain shortness of breath EXAM: CHEST  2 VIEW COMPARISON:  09/11/2016 FINDINGS: Elevation of the right diaphragm. Small bilateral pleural effusions. Bibasilar atelectasis or mild infiltrates. Borderline cardiomegaly with mild central congestion. No pneumothorax. IMPRESSION: 1. Small bilateral pleural effusions with bibasilar atelectasis or infiltrates 2. Borderline cardiomegaly with central vascular congestion. Electronically Signed   By: Donavan Foil M.D.   On: 10/22/2016 18:52    Scheduled Meds: . aspirin EC  81 mg Oral Daily  . calcium carbonate  2 tablet Oral TID WC  . carvedilol  12.5 mg Oral BID WC  . Chlorhexidine Gluconate Cloth  6 each Topical Q0600  . cloNIDine  0.2 mg Transdermal Q Tue  . dextromethorphan-guaiFENesin  1 tablet Oral BID  . doxercalciferol  4 mcg Intravenous Q M,W,F-HD  . insulin aspart  0-15 Units Subcutaneous TID WC  . levothyroxine  25 mcg Oral QAC breakfast  . losartan  100 mg Oral QHS  . mupirocin ointment  1 application Nasal BID  . pantoprazole  40 mg Oral Daily  .  polyethylene glycol  17 g Oral Daily  . pravastatin  20 mg Oral q1800  . risperiDONE  1 mg Oral QAC breakfast  . risperiDONE  1.5 mg Oral QHS  . sevelamer carbonate  800 mg Oral TID WC   Continuous Infusions:   LOS: 2 days   Jurnei Latini, Orpah Melter, MD [facility] Pager [phone]  If 7PM-7AM, please contact night-coverage [email or web address] Password [facility] 10/24/2016, 6:39 PM

## 2016-10-24 NOTE — Progress Notes (Signed)
OT Cancellation Note  Patient Details Name: Anthony Davis MRN: [id number] DOB: 28-Jun-1934   Cancelled Treatment:    Reason Eval/Treat Not Completed: Other (comment). Pt currently working with PT, will try back at later time for eval.  Almon Register [id number] 10/24/2016, 11:16 AM

## 2016-10-24 NOTE — Progress Notes (Signed)
[facility] ROUNDING NOTE   Subjective:   HPI:81 y.o.malewith medical history significant of OSA (recently started on CPAP), pulmonary HTN, HTN, HLD, remote Hep C, DM (diet controlled), and ESRD on MWF  Appears to have Right sided pneumonia and consolidation. CXF showed pleural effusions bilaterally and infiltrates  Hemoglobin low at 6.9 ( 7 last week)  Underwent uneventful dialysis treatment 7/23 with over 4 L removed  Objective:  Vital signs in last 24 hours:  Temp:  [97.4 F (36.3 C)-98.6 F (37 C)] 97.4 F (36.3 C) (07/24 0930) Pulse Rate:  [68-78] 73 (07/24 0930) Resp:  [13-20] 18 (07/24 0930) BP: (150-185)/(66-96) 169/76 (07/24 0930) SpO2:  [92 %-100 %] 97 % (07/24 0930) Weight:  [188 lb 11.4 oz (85.6 kg)-197 lb 8.5 oz (89.6 kg)] 189 lb 11.2 oz (86 kg) (07/23 2301)  Weight change: 15 lb 8.5 oz (7.045 kg) Filed Weights   10/23/16 1626 10/23/16 2055 10/23/16 2301  Weight: 188 lb 11.4 oz (85.6 kg) 188 lb 11.4 oz (85.6 kg) 189 lb 11.2 oz (86 kg)    Intake/Output: I/O last 3 completed shifts: In: 1937 [P.O.:1140; Blood:797] Out: 4850 [Other:4850]   Intake/Output this shift:  Total I/O In: 240 [P.O.:240] Out: -   CVS- RRR RS- CTA ABD- BS present soft non-distended EXT- no edema   Basic Metabolic Panel:  Recent Labs Lab 10/22/16 1827 10/23/16 1240  NA 129* 126*  K 4.3 3.8  CL 92* 92*  CO2 24 27  GLUCOSE 131* 125*  BUN 21* 20  CREATININE 8.58* 8.09*  CALCIUM 9.3 8.8*  PHOS  --  2.5    Liver Function Tests:  Recent Labs Lab 10/22/16 1827 10/23/16 1240  AST 24  --   ALT 30  --   ALKPHOS 91  --   BILITOT 0.9  --   PROT 7.1  --   ALBUMIN 3.6 3.4*    Recent Labs Lab 10/22/16 1827  LIPASE 24   No results for input(s): AMMONIA in the last 168 hours.  CBC:  Recent Labs Lab 10/22/16 1827 10/22/16 2210 10/23/16 0747 10/23/16 1240 10/24/16 0508  WBC 6.7  --   --  5.1 5.8  NEUTROABS 5.0  --   --   --   --   HGB 6.9* 4.5*  9.1* 9.1* 9.5*  HCT 20.8* 13.9* 27.1* 26.3* 28.5*  MCV 93.7  --   --  90.7 93.4  PLT 196  --   --  137* 162    Cardiac Enzymes:  Recent Labs Lab 10/22/16 1827  TROPONINI 0.03*    BNP: Invalid input(s): POCBNP  CBG:  Recent Labs Lab 10/23/16 0754 10/23/16 1147 10/23/16 1708 10/23/16 2053 10/24/16 0815  GLUCAP 102* 151* 173* 74 114*    Microbiology: Results for orders placed or performed during the hospital encounter of 10/22/16  Culture, blood (routine x 2) Call MD if unable to obtain prior to antibiotics being given     Status: None (Preliminary result)   Collection Time: 10/22/16 10:15 PM  Result Value Ref Range Status   Specimen Description BLOOD RIGHT FOOT  Final   Special Requests   Final    BOTTLES DRAWN AEROBIC AND ANAEROBIC Blood Culture results may not be optimal due to an inadequate volume of blood received in culture bottles   Culture NO GROWTH < 24 HOURS  Final   Report Status PENDING  Incomplete  MRSA PCR Screening     Status: Abnormal   Collection Time: 10/23/16 12:07  AM  Result Value Ref Range Status   MRSA by PCR POSITIVE (A) NEGATIVE Final    Comment:        The GeneXpert MRSA Assay (FDA approved for NASAL specimens only), is one component of a comprehensive MRSA colonization surveillance program. It is not intended to diagnose MRSA infection nor to guide or monitor treatment for MRSA infections. RESULT CALLED TO, READ BACK BY AND VERIFIED WITH: RN C. DAVIS [id number] $[email or web address]'@0415'[email or web address]$  THANEY     Coagulation Studies: No results for input(s): LABPROT, INR in the last 72 hours.  Urinalysis: No results for input(s): COLORURINE, LABSPEC, PHURINE, GLUCOSEU, HGBUR, BILIRUBINUR, KETONESUR, PROTEINUR, UROBILINOGEN, NITRITE, LEUKOCYTESUR in the last 72 hours.  Invalid input(s): APPERANCEUR    Imaging: Ct Abdomen Pelvis Wo Contrast  Result Date: 10/23/2016 CLINICAL DATA:  Sudden onset of abdominal pain. Decreased hemoglobin. EXAM: CT ABDOMEN AND PELVIS  WITHOUT CONTRAST TECHNIQUE: Multidetector CT imaging of the abdomen and pelvis was performed following the standard protocol without IV contrast. COMPARISON:  CT 01/16/2015 FINDINGS: Lower chest: Bilateral pleural effusions, moderate on the right and small on the left. These measure simple fluid density. Adjacent compressive atelectasis. There multi chamber cardiomegaly with mild septal thickening. Scattered basilar calcifications. Hepatobiliary: No evidence for focal lesion allowing for lack contrast. Gallbladder physiologically distended, no calcified stone. No biliary dilatation. Pancreas: No ductal dilatation or inflammation. Spleen: Normal in size without focal abnormality. Adrenals/Urinary Tract: Mild thickening on the left adrenal gland without discrete nodule. No right adrenal nodule. Bilateral renal cortical thinning. Left renal cyst. Small hyperdense cyst in the mid left kidney. Urinary bladder is nondistended. Mild bladder wall thickening. Stomach/Bowel: Stomach is physiologically distended. No small bowel dilatation or obstruction. There is mild fecalization of distal small bowel contents. Moderate to large stool in the cecum and ascending colon. Mild gaseous distention of transverse colon. Small volume of stool in the more distal colon. Descending and sigmoid colonic diverticulosis without acute inflammation. There is sigmoid colonic redundancy. Normal appendix. Vascular/Lymphatic: Aortic and branch atherosclerosis. No pathologically enlarged abdominal or pelvic lymph nodes. Reproductive: Penile prosthesis in place. Unremarkable prostate gland. Other: No free air, free fluid, or intra-abdominal fluid collection. No evidence of retroperitoneal hemorrhage. Mild presacral edema is chronic. Musculoskeletal: There are no acute or suspicious osseous abnormalities. Bilateral intramuscular gluteal calcifications are unchanged. IMPRESSION: 1. No findings to suggest intra-abdominal hemorrhage. 2. Colonic  diverticulosis without diverticulitis. Colonic redundancy with moderate proximal stool burden, can be seen with constipation. No acute bowel inflammation. 3. Moderate right and small left pleural effusion. Cardiomegaly with septal thickening, findings consistent with CHF. 4.  Aortic Atherosclerosis (ICD10-I70.0). Electronically Signed   By: Jeb Levering M.D.   On: 10/23/2016 00:54   Dg Chest 2 View  Result Date: 10/22/2016 CLINICAL DATA:  Chest pain shortness of breath EXAM: CHEST  2 VIEW COMPARISON:  09/11/2016 FINDINGS: Elevation of the right diaphragm. Small bilateral pleural effusions. Bibasilar atelectasis or mild infiltrates. Borderline cardiomegaly with mild central congestion. No pneumothorax. IMPRESSION: 1. Small bilateral pleural effusions with bibasilar atelectasis or infiltrates 2. Borderline cardiomegaly with central vascular congestion. Electronically Signed   By: Donavan Foil M.D.   On: 10/22/2016 18:52     Medications:    . aspirin EC  81 mg Oral Daily  . calcium carbonate  2 tablet Oral TID WC  . carvedilol  12.5 mg Oral BID WC  . Chlorhexidine Gluconate Cloth  6 each Topical Q0600  . cloNIDine  0.2 mg Transdermal Q Tue  . dextromethorphan-guaiFENesin  1 tablet Oral BID  . doxercalciferol  4 mcg Intravenous Q M,W,F-HD  . insulin aspart  0-15 Units Subcutaneous TID WC  . levothyroxine  25 mcg Oral QAC breakfast  . losartan  100 mg Oral QHS  . mupirocin ointment  1 application Nasal BID  . pantoprazole  40 mg Oral Daily  . polyethylene glycol  17 g Oral Daily  . pravastatin  20 mg Oral q1800  . risperiDONE  1 mg Oral QAC breakfast  . risperiDONE  1.5 mg Oral QHS  . sevelamer carbonate  800 mg Oral TID WC  . sorbitol, milk of mag, mineral oil, glycerin (SMOG) enema  960 mL Rectal Once   acetaminophen **OR** acetaminophen, albuterol, calcium carbonate (dosed in mg elemental calcium), camphor-menthol **AND** hydrOXYzine, chlorpheniramine-HYDROcodone, docusate sodium,  feeding supplement (NEPRO CARB STEADY), hydrALAZINE, HYDROmorphone (DILAUDID) injection, ondansetron **OR** ondansetron (ZOFRAN) IV, polyethylene glycol, sevelamer carbonate, sorbitol, zolpidem  Assessment/ Plan:   ESRD-MWF dialysis [place]  Will continue regimen today  ANEMIA- Will continue to follow  It appears that the patient will need transfusion and ESA administration   MBD- Will follow  -- binders  - sevelemer and IV hectoral  HTN/VOL- Consistent with marked volume overload continue to challenge EDW   ACCESS- AVF    LOS: 2 Zissel Biederman W $RemoveBefor'@TODAY'[email or web address]$ '@11'$ :19 AM

## 2016-10-25 DIAGNOSIS — D631 Anemia in chronic kidney disease: Secondary | ICD-10-CM

## 2016-10-25 DIAGNOSIS — R0902 Hypoxemia: Secondary | ICD-10-CM

## 2016-10-25 DIAGNOSIS — Z9989 Dependence on other enabling machines and devices: Secondary | ICD-10-CM

## 2016-10-25 DIAGNOSIS — J9621 Acute and chronic respiratory failure with hypoxia: Secondary | ICD-10-CM

## 2016-10-25 DIAGNOSIS — E119 Type 2 diabetes mellitus without complications: Secondary | ICD-10-CM

## 2016-10-25 DIAGNOSIS — D649 Anemia, unspecified: Secondary | ICD-10-CM

## 2016-10-25 DIAGNOSIS — N189 Chronic kidney disease, unspecified: Secondary | ICD-10-CM

## 2016-10-25 DIAGNOSIS — G4733 Obstructive sleep apnea (adult) (pediatric): Secondary | ICD-10-CM

## 2016-10-25 DIAGNOSIS — Z992 Dependence on renal dialysis: Secondary | ICD-10-CM

## 2016-10-25 DIAGNOSIS — N186 End stage renal disease: Secondary | ICD-10-CM

## 2016-10-25 DIAGNOSIS — R109 Unspecified abdominal pain: Secondary | ICD-10-CM

## 2016-10-25 DIAGNOSIS — J189 Pneumonia, unspecified organism: Secondary | ICD-10-CM

## 2016-10-25 LAB — RENAL FUNCTION PANEL
ALBUMIN: 3 g/dL — AB (ref 3.5–5.0)
ANION GAP: 6 (ref 5–15)
BUN: 19 mg/dL (ref 6–20)
CO2: 29 mmol/L (ref 22–32)
Calcium: 9 mg/dL (ref 8.9–10.3)
Chloride: 92 mmol/L — ABNORMAL LOW (ref 101–111)
Creatinine, Ser: 8.21 mg/dL — ABNORMAL HIGH (ref 0.61–1.24)
GFR, EST AFRICAN AMERICAN: 6 mL/min — AB (ref >60)
GFR, EST NON AFRICAN AMERICAN: 5 mL/min — AB (ref >60)
Glucose, Bld: 113 mg/dL — ABNORMAL HIGH (ref 65–99)
PHOSPHORUS: 3.3 mg/dL (ref 2.5–4.6)
POTASSIUM: 5.3 mmol/L — AB (ref 3.5–5.1)
Sodium: 127 mmol/L — ABNORMAL LOW (ref 135–145)

## 2016-10-25 LAB — CBC
HEMATOCRIT: 27.2 % — AB (ref 39.0–52.0)
HEMOGLOBIN: 9 g/dL — AB (ref 13.0–17.0)
MCH: 30.9 pg (ref 26.0–34.0)
MCHC: 33.1 g/dL (ref 30.0–36.0)
MCV: 93.5 fL (ref 78.0–100.0)
Platelets: 159 10*3/uL (ref 150–400)
RBC: 2.91 MIL/uL — AB (ref 4.22–5.81)
RDW: 15.1 % (ref 11.5–15.5)
WBC: 5.9 10*3/uL (ref 4.0–10.5)

## 2016-10-25 LAB — GLUCOSE, CAPILLARY
GLUCOSE-CAPILLARY: 152 mg/dL — AB (ref 65–99)
Glucose-Capillary: 130 mg/dL — ABNORMAL HIGH (ref 65–99)

## 2016-10-25 MED ORDER — LIDOCAINE-PRILOCAINE 2.5-2.5 % EX CREA: 1.0000 "application " | TOPICAL_CREAM | CUTANEOUS | Status: DC | PRN

## 2016-10-25 MED ORDER — PENTAFLUOROPROP-TETRAFLUOROETH EX AERO: 1.0000 "application " | INHALATION_SPRAY | CUTANEOUS | Status: DC | PRN

## 2016-10-25 MED ORDER — SODIUM CHLORIDE 0.9 % IV SOLN: 100.0000 mL | INTRAVENOUS | Status: DC | PRN

## 2016-10-25 MED ORDER — ALTEPLASE 2 MG IJ SOLR
2.0000 mg | Freq: Once | INTRAMUSCULAR | Status: DC | PRN
Start: 2016-10-25 — End: 2016-10-25

## 2016-10-25 MED ORDER — LIDOCAINE HCL (PF) 1 % IJ SOLN: 5.0000 mL | INTRAMUSCULAR | Status: DC | PRN

## 2016-10-25 MED ORDER — DOXERCALCIFEROL 4 MCG/2ML IV SOLN
INTRAVENOUS | Status: AC
  Administered 2016-10-25: 4 ug via INTRAVENOUS
  Filled 2016-10-25: qty 2

## 2016-10-25 MED ORDER — ASPIRIN 81 MG PO TBEC: 81.0000 mg | DELAYED_RELEASE_TABLET | Freq: Every day | ORAL | 0 refills | Status: AC

## 2016-10-25 MED ORDER — HEPARIN SODIUM (PORCINE) 1000 UNIT/ML DIALYSIS: 1000.0000 [IU] | INTRAMUSCULAR | Status: DC | PRN

## 2016-10-25 MED ORDER — ALTEPLASE 2 MG IJ SOLR: 2.0000 mg | Freq: Once | INTRAMUSCULAR | Status: DC | PRN

## 2016-10-25 NOTE — Discharge Summary (Signed)
Physician Discharge Summary  Anthony Davis PIR:[id number] DOB: Jan 06, 1935 DOA: 10/22/2016  PCP: Glendon Axe, MD  Admit date: 10/22/2016 Discharge date: 10/25/2016  Time spent: 45 minutes  Recommendations for Outpatient Follow-up:  Patient will be discharged to home with physical therapy home health and home oxgyen.  Patient will need to follow up with primary care provider within one week of discharge.  Continue dialysis as scheduled. Patient should continue medications as prescribed.  Patient should follow a Renal/carb modified diet.   Discharge Diagnoses:  Acute on chronic respiratory failure Anemia of chronic disease with abdominal pain End-stage lung disease on hemodialysis Diabetes mellitus, type II Constipation Acute on chronic diastolic heart failure Essential hypertension Physical deconditioning  Discharge Condition: Stable  Diet recommendation: Renal/carb modified  Filed Weights   10/24/16 2200 10/25/16 0746 10/25/16 1158  Weight: 85.8 kg (189 lb 3.2 oz) 86.4 kg (190 lb 7.6 oz) 82.5 kg (181 lb 14.1 oz)    History of present illness:  On 10/22/2016 by Dr. Karmen Bongo Anthony Davis is a 82 y.o. male with medical history significant of OSA (recently started on CPAP), pulmonary HTN, HTN, HLD, remote Hep C, DM (diet controlled), and ESRD on MWF HD presenting with cough.  Patient has been gagging with nothing coming up for 3 days.  Abdominal pain for coughing.  Coughed yesterday all day long. +SOB. Nonproductive cough other than white sputum a couple of times.  +sick contacts at HD.  No fever.  No chills.  +abdominal pain, mid-umbilical.  He saw blood in his stools a couple of months ago, "straining and bust a vessel", bright red blood.  No dark stools now.  Last BM was about 3 days ago, which is normal for him and sometimes even longer.  +weak/tired quite a while, maybe 2-3 weeks. Has required a cane recently and had falls recently.  Hospital Course:  Acute on chronic  respiratory failure -Patient has chronic respiratory issues including pulmonary hypertension, history of tobacco dependence and objective sleep apnea currently on CPAP -Patient presented with nonproductive cough as well as shortness of breath. Images indicated this was secondary to volume overload -Oxygen saturations of 91% on room air on admission -Overall patient appears to have improved and is currently stable. -Continue to address volume status with hemodialysis -Monitored O2 sats, on Room air at rest 91%. O2 sats dropped to 76% on room air with ambulation.  -Will discharge with home oxygen  Anemia of chronic disease with abdominal pain -Patient's hemoglobin did drop to 4.5 during hospitalization, he was given 2 units PRBC -Currently hemoglobin has remained stable and is currently 9.5 -Currently no signs are symptoms of acute blood loss -Continue to monitor CBC with dialysis  End-stage lung disease on hemodialysis -Patient dialyzes on a Wednesday Friday and [facility] -Nephrology consulted and appreciated. Discussed with Dr. Justin Mend, ok to continue next HD on 7/27 as outpatient -Continue hemodialysis and volume control  Diabetes mellitus, type II -Currently patient reports no longer having diabetes, suspect diet-controlled -Appears to be stable at this time -Last hemoglobin A1c was 4.6  Constipation -Imaging showed large stool burden, patient did require multiple enemas -He reports using lactulose at home for constipation however complains that the lactulose is too sweet -Patient did have a bowel movement during hospitalization on 10/24/2016 -Continue MiraLAX  Acute on chronic diastolic heart failure -Volume being controlled with hemodialysis -Continue coreg, aspirin, losartan, statin  Essential hypertension -Continue Coreg, clonidine, losartan, amlodipine  Physical deconditioning -PT consulted home health with rolling walker  5  wheels  Procedures: None  Consultations: Nephrology  Discharge Exam: Vitals:   10/25/16 1158 10/25/16 1250  BP: (!) 191/92 (!) 174/75  Pulse: 71 75  Resp: 12 14  Temp: 97.9 F (36.6 C) 98.1 F (36.7 C)   Patient states he is feeling better today. Denies any chest pain, shortness of breath, abdominal pain, nausea or vomiting, headache or dizziness.   General: Well developed, well nourished, NAD, appears stated age  57: NCAT, mucous membranes moist.  Cardiovascular: S1 S2 auscultated, RRR  Respiratory: Clear to auscultation bilaterally with equal chest rise, no wheezing  Abdomen: Soft, nontender, nondistended, + bowel sounds  Extremities: warm dry without cyanosis clubbing or edema  Neuro: AAOx3, nonfocal  Psych: appropriate mood and affect  Discharge Instructions Discharge Instructions    Discharge instructions    Complete by:  As directed    Patient will be discharged to home with physical therapy home health.  Patient will need to follow up with primary care provider within one week of discharge.  Continue dialysis as scheduled. Patient should continue medications as prescribed.  Patient should follow a Renal/carb modified diet.     Current Discharge Medication List    START taking these medications   Details  aspirin EC 81 MG EC tablet Take 1 tablet (81 mg total) by mouth daily. Qty: 30 tablet, Refills: 0      CONTINUE these medications which have NOT CHANGED   Details  amLODipine (NORVASC) 10 MG tablet Take 10 mg by mouth daily.    calcium carbonate (TUMS EX) 750 MG chewable tablet Chew 1 tablet by mouth 3 (three) times daily.    carvedilol (COREG) 12.5 MG tablet Take 12.5 mg by mouth 2 (two) times daily.  Refills: 1    cloNIDine (CATAPRES - DOSED IN MG/24 HR) 0.2 mg/24hr patch Place 0.2 mg onto the skin every Tuesday.     esomeprazole (NEXIUM) 40 MG capsule Take 40 mg by mouth 2 (two) times daily before a meal.     levothyroxine (SYNTHROID,  LEVOTHROID) 25 MCG tablet Take 25 mcg by mouth daily before breakfast. Refills: 1    losartan (COZAAR) 100 MG tablet Take 100 mg by mouth at bedtime.     lovastatin (MEVACOR) 20 MG tablet Take 20 mg by mouth daily with supper.     polyethylene glycol (MIRALAX / GLYCOLAX) packet Take 17 g by mouth daily as needed (constipation). Mix in 8 oz liquid and drink    !! risperiDONE (RISPERDAL) 0.5 MG tablet Take 1.5 mg by mouth at bedtime.    !! risperiDONE (RISPERDAL) 1 MG tablet Take 1 mg by mouth daily before breakfast.     sevelamer carbonate (RENVELA) 800 MG tablet Take 800 mg by mouth See admin instructions. Take 1 tablet (800 mg) by mouth three times daily with meals and 1 tablet (800 mg)  with snacks - up to 5 tablets daily     !! - Potential duplicate medications found. Please discuss with provider.     Allergies  Allergen Reactions  . Ace Inhibitors Other (See Comments)    Other reaction(s): ELEVATED CREATININE  . Propofol Other (See Comments)    Unknown reaction - reported by [facility] 09/11/16  . Sulfamethoxazole Hives  . Ibuprofen Rash  . Sulfa Antibiotics Rash  . Tetracycline Hives, Swelling and Rash      The results of significant diagnostics from this hospitalization (including imaging, microbiology, ancillary and laboratory) are listed below for reference.  Significant Diagnostic Studies: Ct Abdomen Pelvis Wo Contrast  Result Date: 10/23/2016 CLINICAL DATA:  Sudden onset of abdominal pain. Decreased hemoglobin. EXAM: CT ABDOMEN AND PELVIS WITHOUT CONTRAST TECHNIQUE: Multidetector CT imaging of the abdomen and pelvis was performed following the standard protocol without IV contrast. COMPARISON:  CT 01/16/2015 FINDINGS: Lower chest: Bilateral pleural effusions, moderate on the right and small on the left. These measure simple fluid density. Adjacent compressive atelectasis. There multi chamber cardiomegaly with mild septal thickening. Scattered basilar  calcifications. Hepatobiliary: No evidence for focal lesion allowing for lack contrast. Gallbladder physiologically distended, no calcified stone. No biliary dilatation. Pancreas: No ductal dilatation or inflammation. Spleen: Normal in size without focal abnormality. Adrenals/Urinary Tract: Mild thickening on the left adrenal gland without discrete nodule. No right adrenal nodule. Bilateral renal cortical thinning. Left renal cyst. Small hyperdense cyst in the mid left kidney. Urinary bladder is nondistended. Mild bladder wall thickening. Stomach/Bowel: Stomach is physiologically distended. No small bowel dilatation or obstruction. There is mild fecalization of distal small bowel contents. Moderate to large stool in the cecum and ascending colon. Mild gaseous distention of transverse colon. Small volume of stool in the more distal colon. Descending and sigmoid colonic diverticulosis without acute inflammation. There is sigmoid colonic redundancy. Normal appendix. Vascular/Lymphatic: Aortic and branch atherosclerosis. No pathologically enlarged abdominal or pelvic lymph nodes. Reproductive: Penile prosthesis in place. Unremarkable prostate gland. Other: No free air, free fluid, or intra-abdominal fluid collection. No evidence of retroperitoneal hemorrhage. Mild presacral edema is chronic. Musculoskeletal: There are no acute or suspicious osseous abnormalities. Bilateral intramuscular gluteal calcifications are unchanged. IMPRESSION: 1. No findings to suggest intra-abdominal hemorrhage. 2. Colonic diverticulosis without diverticulitis. Colonic redundancy with moderate proximal stool burden, can be seen with constipation. No acute bowel inflammation. 3. Moderate right and small left pleural effusion. Cardiomegaly with septal thickening, findings consistent with CHF. 4.  Aortic Atherosclerosis (ICD10-I70.0). Electronically Signed   By: Jeb Levering M.D.   On: 10/23/2016 00:54   Dg Chest 2 View  Result Date:  10/22/2016 CLINICAL DATA:  Chest pain shortness of breath EXAM: CHEST  2 VIEW COMPARISON:  09/11/2016 FINDINGS: Elevation of the right diaphragm. Small bilateral pleural effusions. Bibasilar atelectasis or mild infiltrates. Borderline cardiomegaly with mild central congestion. No pneumothorax. IMPRESSION: 1. Small bilateral pleural effusions with bibasilar atelectasis or infiltrates 2. Borderline cardiomegaly with central vascular congestion. Electronically Signed   By: Donavan Foil M.D.   On: 10/22/2016 18:52    Microbiology: Recent Results (from the past 240 hour(s))  Culture, blood (routine x 2) Call MD if unable to obtain prior to antibiotics being given     Status: None (Preliminary result)   Collection Time: 10/22/16 10:00 PM  Result Value Ref Range Status   Specimen Description BLOOD RIGHT FOOT  Final   Special Requests   Final    BOTTLES DRAWN AEROBIC AND ANAEROBIC Blood Culture results may not be optimal due to an inadequate volume of blood received in culture bottles   Culture NO GROWTH 2 DAYS  Final   Report Status PENDING  Incomplete  Culture, blood (routine x 2) Call MD if unable to obtain prior to antibiotics being given     Status: None (Preliminary result)   Collection Time: 10/22/16 10:15 PM  Result Value Ref Range Status   Specimen Description BLOOD RIGHT FOOT  Final   Special Requests   Final    BOTTLES DRAWN AEROBIC AND ANAEROBIC Blood Culture results may not be optimal due to an inadequate volume  of blood received in culture bottles   Culture NO GROWTH 3 DAYS  Final   Report Status PENDING  Incomplete  MRSA PCR Screening     Status: Abnormal   Collection Time: 10/23/16 12:07 AM  Result Value Ref Range Status   MRSA by PCR POSITIVE (A) NEGATIVE Final    Comment:        The GeneXpert MRSA Assay (FDA approved for NASAL specimens only), is one component of a comprehensive MRSA colonization surveillance program. It is not intended to diagnose MRSA infection nor to  guide or monitor treatment for MRSA infections. RESULT CALLED TO, READ BACK BY AND VERIFIED WITH: RN Milana Obey $[email or web address]$  THANEY      Labs: Basic Metabolic Panel:  Recent Labs Lab 10/22/16 1827 10/23/16 1240 10/25/16 0700  NA 129* 126* 127*  K 4.3 3.8 5.3*  CL 92* 92* 92*  CO2 $Re'24 27 29  'bJm$ GLUCOSE 131* 125* 113*  BUN 21* 20 19  CREATININE 8.58* 8.09* 8.21*  CALCIUM 9.3 8.8* 9.0  PHOS  --  2.5 3.3   Liver Function Tests:  Recent Labs Lab 10/22/16 1827 10/23/16 1240 10/25/16 0700  AST 24  --   --   ALT 30  --   --   ALKPHOS 91  --   --   BILITOT 0.9  --   --   PROT 7.1  --   --   ALBUMIN 3.6 3.4* 3.0*    Recent Labs Lab 10/22/16 1827  LIPASE 24   No results for input(s): AMMONIA in the last 168 hours. CBC:  Recent Labs Lab 10/22/16 1827 10/22/16 2210 10/23/16 0747 10/23/16 1240 10/24/16 0508 10/25/16 0802  WBC 6.7  --   --  5.1 5.8 5.9  NEUTROABS 5.0  --   --   --   --   --   HGB 6.9* 4.5* 9.1* 9.1* 9.5* 9.0*  HCT 20.8* 13.9* 27.1* 26.3* 28.5* 27.2*  MCV 93.7  --   --  90.7 93.4 93.5  PLT 196  --   --  137* 162 159   Cardiac Enzymes:  Recent Labs Lab 10/22/16 1827  TROPONINI 0.03*   BNP: BNP (last 3 results) No results for input(s): BNP in the last 8760 hours.  ProBNP (last 3 results) No results for input(s): PROBNP in the last 8760 hours.  CBG:  Recent Labs Lab 10/24/16 0815 10/24/16 1209 10/24/16 1650 10/24/16 2152 10/25/16 1244  GLUCAP 114* 127* 72 101* 130*       Signed:  Kellen Dutch  [facility] 10/25/2016, 2:49 PM

## 2016-10-25 NOTE — Progress Notes (Signed)
Patient discharged to home with family. All discharge instructions reviewed. Scripts reviewed. Patients wheelchair and Home O2 delivered to the room. All belongings with patient. Patient left unit in stable condition via wheelchair.  Sheliah Plane RN

## 2016-10-25 NOTE — Care Management Note (Signed)
Case Management Note  Patient Details  Name: Anthony Davis MRN: [id number] Date of Birth: 26-May-1934  Subjective/Objective:                 Patient will DC to home today, ALF, HH PT through [facility], and RW and Oxygen to room prior to DC. Wife to provide transportation home.    Action/Plan:   Expected Discharge Date:  10/25/16               Expected Discharge Plan:  [place]  In-House Referral:     Discharge planning Services  CM Consult  Post Acute Care Choice:  Durable Medical Equipment, Home Health Choice offered to:  Patient  DME Arranged:  Oxygen, Walker rolling DME Agency:  [place]:  PT [place]:  [place]  Status of Service:  Completed, signed off  If discussed at [place] of Stay Meetings, dates discussed:    Additional Comments:  Carles Collet, RN 10/25/2016, 3:16 PM

## 2016-10-25 NOTE — Progress Notes (Signed)
SATURATION QUALIFICATIONS: (This note is used to comply with regulatory documentation for home oxygen)  Patient Saturations on Room Air at Rest = 91%  Patient Saturations on Room Air while Ambulating = 76%  Patient Saturations on 2 Liters of oxygen while Ambulating = 92%  Please briefly explain why patient needs home oxygen:

## 2016-10-25 NOTE — Evaluation (Signed)
Occupational Therapy Evaluation Patient Details Name: Anthony Davis MRN: [id number] DOB: 1934-11-18 Today's Date: 10/25/2016    History of Present Illness 82 yo male with onset of HCAP and acute on chronic resp failure was admitted, had GI bleed and transfused.  PMHx:  HTN, DM, Hep C, OSA, HD, pulm HTN,    Clinical Impression   Pt reports he was mod I with BADL PTA. Currently pt overall min guard for ADL and functional mobility. Pt with DOE 2/4; educated on energy conservation, pursed lip breathing, and fall prevention. Pt planning to d/c home with supervision from his wife. Recommending HHOT for follow up to maximize independence and safety with ADL and functional mobility. Pt would benefit from continued skilled OT to address established goals.    Follow Up Recommendations  Home health OT;Supervision/Assistance - 24 hour    Equipment Recommendations  None recommended by OT    Recommendations for Other Services       Precautions / Restrictions Precautions Precautions: Fall Restrictions Weight Bearing Restrictions: No      Mobility Bed Mobility Overal bed mobility: Modified Independent                Transfers Overall transfer level: Needs assistance Equipment used: Rolling walker (2 wheeled) Transfers: Sit to/from Stand Sit to Stand: Min guard         General transfer comment: cues for hand placement.    Balance Overall balance assessment: Needs assistance Sitting-balance support: Feet supported Sitting balance-Leahy Scale: Good     Standing balance support: Bilateral upper extremity supported Standing balance-Leahy Scale: Poor                             ADL either performed or assessed with clinical judgement   ADL Overall ADL's : Needs assistance/impaired Eating/Feeding: Set up;Sitting   Grooming: Set up;Sitting   Upper Body Bathing: Set up;Sitting;Supervision/ safety   Lower Body Bathing: Min guard;Sit to/from stand   Upper Body  Dressing : Set up;Supervision/safety;Sitting   Lower Body Dressing: Min guard;Sit to/from stand   Toilet Transfer: Min guard;Ambulation;Regular Toilet;RW       Tub/ Shower Transfer: Min guard;Tub transfer;Ambulation;Grab bars Tub/Shower Transfer Details (indicate cue type and reason): simulated in room Functional mobility during ADLs: Min guard;Rolling walker General ADL Comments: Educated pt on energy conservation strategies and use of RW for fall prevention     Vision         Perception     Praxis      Pertinent Vitals/Pain Pain Assessment: No/denies pain     Hand Dominance Right   Extremity/Trunk Assessment Upper Extremity Assessment Upper Extremity Assessment: Overall WFL for tasks assessed   Lower Extremity Assessment Lower Extremity Assessment: Defer to PT evaluation   Cervical / Trunk Assessment Cervical / Trunk Assessment: Normal   Communication Communication Communication: HOH   Cognition Arousal/Alertness: Awake/alert Behavior During Therapy: WFL for tasks assessed/performed Overall Cognitive Status: No family/caregiver present to determine baseline cognitive functioning                                     General Comments       Exercises     Shoulder Instructions      Home Living Family/patient expects to be discharged to:: Assisted living Living Arrangements: Spouse/significant other  Home Equipment: Kasandra Knudsen - single point;Shower seat - built in;Grab bars - toilet;Grab bars - tub/shower          Prior Functioning/Environment Level of Independence: Needs assistance  Gait / Transfers Assistance Needed: SPC for gait in facility ADL's / Homemaking Assistance Needed: ALF staff for assisting with housework and cooking, mod I with BADL            OT Problem List: Decreased strength;Decreased activity tolerance;Impaired balance (sitting and/or standing);Decreased knowledge of use of DME or  AE;Decreased safety awareness      OT Treatment/Interventions: Self-care/ADL training;Energy conservation;DME and/or AE instruction;Therapeutic exercise;Therapeutic activities;Patient/family education;Balance training    OT Goals(Current goals can be found in the care plan section) Acute Rehab OT Goals Patient Stated Goal: return home today OT Goal Formulation: With patient Time For Goal Achievement: 11/08/16 Potential to Achieve Goals: Good ADL Goals Pt Will Transfer to Toilet: with modified independence;ambulating;regular height toilet Pt Will Perform Toileting - Clothing Manipulation and hygiene: with modified independence;sit to/from stand Additional ADL Goal #1: Pt will gather ADL items and perfrom UB/LB bathing/dressing with mod I. Additional ADL Goal #2: Pt will independently verbally recall 3 energy conservation strategies and use during ADL.  OT Frequency: Min 2X/week   Barriers to D/C:            Co-evaluation              AM-PAC PT "6 Clicks" Daily Activity     Outcome Measure Help from another person eating meals?: None Help from another person taking care of personal grooming?: A Little Help from another person toileting, which includes using toliet, bedpan, or urinal?: A Little Help from another person bathing (including washing, rinsing, drying)?: A Little Help from another person to put on and taking off regular upper body clothing?: A Little Help from another person to put on and taking off regular lower body clothing?: A Little 6 Click Score: 19   End of Session Equipment Utilized During Treatment: Rolling walker;Oxygen Nurse Communication: Mobility status  Activity Tolerance: Patient tolerated treatment well Patient left: with call bell/phone within reach;Other (comment);with nursing/sitter in room (sitting EOB)  OT Visit Diagnosis: Unsteadiness on feet (R26.81);History of falling (Z91.81)                Time: 1937-9024 OT Time Calculation (min): 16  min Charges:  OT General Charges $OT Visit: 1 Procedure OT Evaluation $OT Eval Moderate Complexity: 1 Procedure G-Codes:     Vandy Tsuchiya A. Ulice Brilliant, M.S., OTR/L Pager: [phone] 10/25/2016, 4:52 PM

## 2016-10-25 NOTE — Progress Notes (Signed)
OT Cancellation Note  Patient Details Name: Ameer Sanden MRN: [id number] DOB: [birth year]   Cancelled Treatment:    Reason Eval/Treat Not Completed: Patient at procedure or test/ unavailable (currently in HD). Will follow up for OT eval as time allows.  Binnie Kand  M.S., OTR/L Pager: [phone]  10/25/2016, 11:19 AM

## 2016-10-25 NOTE — Procedures (Signed)
I have seen and examined this patient and agree with the plan of care   Patient to be discharged after dialysis   Continue to challenge EDW as outpatient  [place]  [facility] 10/25/2016, 11:48 AM

## 2016-10-27 LAB — CULTURE, BLOOD (ROUTINE X 2): CULTURE: NO GROWTH

## 2016-10-28 LAB — CULTURE, BLOOD (ROUTINE X 2): Culture: NO GROWTH

## 2017-01-15 ENCOUNTER — Emergency Department (HOSPITAL_COMMUNITY): Payer: Medicare Other

## 2017-01-15 ENCOUNTER — Encounter (HOSPITAL_COMMUNITY): Payer: Self-pay | Admitting: *Deleted

## 2017-01-15 ENCOUNTER — Emergency Department (HOSPITAL_COMMUNITY)
Admission: EM | Admit: 2017-01-15 | Discharge: 2017-01-15 | Disposition: A | Discharge status: Home / Self Care | Payer: Medicare Other | Attending: Emergency Medicine | Admitting: Emergency Medicine

## 2017-01-15 DIAGNOSIS — J45909 Unspecified asthma, uncomplicated: Secondary | ICD-10-CM | POA: Diagnosis not present

## 2017-01-15 DIAGNOSIS — I509 Heart failure, unspecified: Secondary | ICD-10-CM | POA: Insufficient documentation

## 2017-01-15 DIAGNOSIS — R112 Nausea with vomiting, unspecified: Secondary | ICD-10-CM | POA: Diagnosis not present

## 2017-01-15 DIAGNOSIS — Z87891 Personal history of nicotine dependence: Secondary | ICD-10-CM | POA: Diagnosis not present

## 2017-01-15 DIAGNOSIS — R05 Cough: Secondary | ICD-10-CM | POA: Insufficient documentation

## 2017-01-15 DIAGNOSIS — E1122 Type 2 diabetes mellitus with diabetic chronic kidney disease: Secondary | ICD-10-CM | POA: Diagnosis not present

## 2017-01-15 DIAGNOSIS — K5909 Other constipation: Secondary | ICD-10-CM | POA: Diagnosis not present

## 2017-01-15 DIAGNOSIS — R0602 Shortness of breath: Secondary | ICD-10-CM | POA: Insufficient documentation

## 2017-01-15 DIAGNOSIS — Z992 Dependence on renal dialysis: Secondary | ICD-10-CM | POA: Insufficient documentation

## 2017-01-15 DIAGNOSIS — N186 End stage renal disease: Secondary | ICD-10-CM | POA: Insufficient documentation

## 2017-01-15 DIAGNOSIS — Z79899 Other long term (current) drug therapy: Secondary | ICD-10-CM | POA: Diagnosis not present

## 2017-01-15 DIAGNOSIS — I132 Hypertensive heart and chronic kidney disease with heart failure and with stage 5 chronic kidney disease, or end stage renal disease: Secondary | ICD-10-CM | POA: Diagnosis not present

## 2017-01-15 DIAGNOSIS — R1084 Generalized abdominal pain: Secondary | ICD-10-CM | POA: Diagnosis not present

## 2017-01-15 LAB — CBC
HCT: 38.2 % — ABNORMAL LOW (ref 39.0–52.0)
Hemoglobin: 12.8 g/dL — ABNORMAL LOW (ref 13.0–17.0)
MCH: 32.2 pg (ref 26.0–34.0)
MCHC: 33.5 g/dL (ref 30.0–36.0)
MCV: 96.2 fL (ref 78.0–100.0)
PLATELETS: 151 10*3/uL (ref 150–400)
RBC: 3.97 MIL/uL — AB (ref 4.22–5.81)
RDW: 16.1 % — AB (ref 11.5–15.5)
WBC: 6.2 10*3/uL (ref 4.0–10.5)

## 2017-01-15 LAB — COMPREHENSIVE METABOLIC PANEL
ALT: 30 U/L (ref 17–63)
AST: 26 U/L (ref 15–41)
Albumin: 4.2 g/dL (ref 3.5–5.0)
Alkaline Phosphatase: 89 U/L (ref 38–126)
Anion gap: 17 — ABNORMAL HIGH (ref 5–15)
BILIRUBIN TOTAL: 0.7 mg/dL (ref 0.3–1.2)
BUN: 27 mg/dL — AB (ref 6–20)
CO2: 24 mmol/L (ref 22–32)
CREATININE: 8.76 mg/dL — AB (ref 0.61–1.24)
Calcium: 8.9 mg/dL (ref 8.9–10.3)
Chloride: 91 mmol/L — ABNORMAL LOW (ref 101–111)
GFR, EST AFRICAN AMERICAN: 6 mL/min — AB (ref >60)
GFR, EST NON AFRICAN AMERICAN: 5 mL/min — AB (ref >60)
Glucose, Bld: 122 mg/dL — ABNORMAL HIGH (ref 65–99)
POTASSIUM: 5.1 mmol/L (ref 3.5–5.1)
Sodium: 132 mmol/L — ABNORMAL LOW (ref 135–145)
TOTAL PROTEIN: 7.8 g/dL (ref 6.5–8.1)

## 2017-01-15 MED ORDER — PEG 3350-KCL-NA BICARB-NACL 420 G PO SOLR
4000.0000 mL | Freq: Once | ORAL | Status: AC
  Administered 2017-01-15: 4000 mL via ORAL
  Filled 2017-01-15 (×3): qty 4000

## 2017-01-15 NOTE — ED Notes (Signed)
Edp advising to hold  bp meds due to patient going to dialysis today.

## 2017-01-15 NOTE — ED Provider Notes (Signed)
[facility]-EMERGENCY DEPT Provider Note   CSN: [id number] Arrival date & time: 01/15/17  4371    History   Chief Complaint Chief Complaint  Patient presents with  . Abdominal Pain    HPI Anthony Davis is a 82 y.o. male with history of CHF, ESRD on HD MWF, well-controlled T2 DM, HTN, HLD, OSA on CPAP, pulmonary HTN, GERD, remote history of Hep C who presents to the ED with acute and chronic abdominal pain. Patient is accompanied by wife. States abdominal pain has been present for months and he localizes it to the center of his abdomen and epigastric region. It is non-radiating and he describes it as stabbing. It is associated with dry heaving, nausea, and vomiting times. States vomiting has been getting worse. Denies association with food. Last bowel movement was yesterday and states it was normal for him. No melena or hematochezia. States he has bowel movements once a week. States abdominal pain improved after defecation, but has not been able to identify other alleviating or aggravating factors. Has been on multiple medications for constipation including lactulose. Currently on Linzess (dose recently increased). No other recent changes in medications. Has also been trying prune juice. His wife states his appetite has decreased and he had lost some weight unintentionally (~4-7 lbs in the past month)  Has been told by his PCP that he could be diverticulitis however has not had a colonoscopy in many years. He also endorses a cough and SOB that is chronic in nature and unchanged. Denies recent illness, fever, chills, HA, chest pain, changes in bowel movements and lower extremity swelling.   On review of chart, patient required a bowel cleanout during his last admission on 7/20 daily. CT abdomen/pelvis at this time show moderate colonic stool burden a colonic diverticulosis without diverticulitis but no other abnormalities.  HPI  Past Medical History:  Diagnosis Date  . Anemia    low iron  .  Arthritis   . Asthma   . Chronic kidney disease    dialysis M/W/F  . Constipation   . Diabetes mellitus without complication (HCC)    not on any medications  . GERD (gastroesophageal reflux disease)   . Headache   . Hepatitis    Hepatitis C   . High cholesterol   . Hypertension   . Pulmonary hypertension (HCC)   . Shortness of breath dyspnea   . Sleep apnea    Got CPAP about 1 week ago, setting is five    Patient Active Problem List   Diagnosis Date Noted  . CHF exacerbation (HCC) 10/23/2016  . ESRD on dialysis (HCC) 10/23/2016  . Diabetes mellitus type 2, diet-controlled (HCC) 10/23/2016  . Anemia due to chronic kidney disease 10/23/2016  . Acute on chronic respiratory failure with hypoxia (HCC) 10/23/2016  . OSA on CPAP 10/23/2016  . HCAP (healthcare-associated pneumonia) 10/22/2016    Past Surgical History:  Procedure Laterality Date  . AV FISTULA PLACEMENT    . CATARACT EXTRACTION W/PHACO Left 03/17/2015   Procedure: Cancelled procedure;  Surgeon: Chalmers Guest, MD;  Location: [facility] OR;  Service: Ophthalmology;  Laterality: Left;  . COLONOSCOPY    . JOINT REPLACEMENT Left    knee  . PENILE PROSTHESIS IMPLANT         Home Medications    Prior to Admission medications   Medication Sig Start Date End Date Taking? Authorizing Provider  acetaminophen (TYLENOL) 500 MG tablet Take 1,000 mg by mouth every 6 (six) hours as needed for mild pain.  Yes [provider]  amitriptyline (ELAVIL) 25 MG tablet Take 25-50 mg by mouth at bedtime as needed. 12/11/16  Yes [provider]  amLODipine (NORVASC) 10 MG tablet Take 10 mg by mouth daily.   Yes [provider]  aspirin EC 81 MG EC tablet Take 1 tablet (81 mg total) by mouth daily. 10/25/16  Yes Mikhail, Maryann, DO  carvedilol (COREG) 12.5 MG tablet Take 12.5 mg by mouth 2 (two) times daily.  07/30/16  Yes [provider]  cloNIDine (CATAPRES - DOSED IN MG/24 HR) 0.2 mg/24hr patch Place 0.2  mg onto the skin every Tuesday.    Yes [provider]  esomeprazole (NEXIUM) 40 MG capsule Take 40 mg by mouth 2 (two) times daily before a meal.    Yes [provider]  levothyroxine (SYNTHROID, LEVOTHROID) 25 MCG tablet Take 25 mcg by mouth daily before breakfast. 08/27/16  Yes [provider]  losartan (COZAAR) 100 MG tablet Take 100 mg by mouth at bedtime.    Yes [provider]  lovastatin (MEVACOR) 20 MG tablet Take 20 mg by mouth daily with supper.    Yes [provider]  metoprolol tartrate (LOPRESSOR) 50 MG tablet Take 50 mg by mouth 2 (two) times daily.   Yes [provider]  risperiDONE (RISPERDAL) 0.5 MG tablet Take 0.5 mg by mouth 2 (two) times daily.    Yes [provider]  sevelamer carbonate (RENVELA) 800 MG tablet Take 800 mg by mouth 2 (two) times daily with a meal.    Yes [provider]  Vitamin D, Ergocalciferol, (DRISDOL) 50000 units CAPS capsule Take 50,000 Units by mouth once a week. friday 01/05/17  Yes [provider]  calcium carbonate (TUMS EX) 750 MG chewable tablet Chew 1 tablet by mouth 3 (three) times daily.    [provider]  polyethylene glycol (MIRALAX / GLYCOLAX) packet Take 17 g by mouth daily as needed (constipation). Mix in 8 oz liquid and drink    [provider]  risperiDONE (RISPERDAL) 1 MG tablet Take 1 mg by mouth daily before breakfast.     [provider]    Family History No family history on file.  Social History Social History  Substance Use Topics  . Smoking status: Former Smoker    Quit date: 03/15/1985  . Smokeless tobacco: Never Used  . Alcohol use No     Allergies   Ace inhibitors; Propofol; Sulfamethoxazole; Ibuprofen; Sulfa antibiotics; and Tetracycline   Review of Systems Review of Systems  Constitutional: Positive for appetite change and unexpected weight change. Negative for activity change, chills, diaphoresis, fatigue  and fever.  Respiratory: Positive for cough and shortness of breath. Negative for choking and chest tightness.   Cardiovascular: Negative for chest pain, palpitations and leg swelling.  Gastrointestinal: Positive for abdominal pain, constipation, nausea and vomiting. Negative for abdominal distention, blood in stool and diarrhea.  Genitourinary: Negative for flank pain.  Musculoskeletal: Negative for arthralgias and myalgias.  Skin: Negative for pallor and rash.  Neurological: Negative for dizziness, weakness and light-headedness.  All other systems reviewed and are negative.    Physical Exam Updated Vital Signs BP (!) 188/93   Pulse 73   Temp (!) 97.4 F (36.3 C)   Resp 19   SpO2 98%   Physical Exam  Constitutional: He is oriented to person, place, and time.  Very pleasant male, well-nourished, well-developed, lying in bed in no acute distress  Cardiovascular: Normal rate, regular rhythm  and normal heart sounds.  Exam reveals no gallop and no friction rub.   No murmur heard. Pulmonary/Chest: Effort normal and breath sounds normal. No respiratory distress.  Abdominal:  Abdomen is soft. He reports mild tenderness on deep palpation but does not seem uncomfortable during exam. Abdomen is nondistended. Normoactive bowel sounds. No rebound tenderness or guarding noted. Tympanic to percussion.  Neurological: He is alert and oriented to person, place, and time.  Skin: Skin is warm. Capillary refill takes less than 2 seconds.  Nursing note and vitals reviewed.    ED Treatments / Results  Labs (all labs ordered are listed, but only abnormal results are displayed) Labs Reviewed  COMPREHENSIVE METABOLIC PANEL - Abnormal; Notable for the following:       Result Value   Sodium 132 (*)    Chloride 91 (*)    Glucose, Bld 122 (*)    BUN 27 (*)    Creatinine, Ser 8.76 (*)    GFR calc non Af Amer 5 (*)    GFR calc Af Amer 6 (*)    Anion gap 17 (*)    All other components within normal  limits  CBC - Abnormal; Notable for the following:    RBC 3.97 (*)    Hemoglobin 12.8 (*)    HCT 38.2 (*)    RDW 16.1 (*)    All other components within normal limits  URINALYSIS, ROUTINE W REFLEX MICROSCOPIC    EKG  EKG Interpretation  Date/Time:  Monday January 15 2017 06:36:35 EDT Ventricular Rate:  70 PR Interval:    QRS Duration: 104 QT Interval:  445 QTC Calculation: 481 R Axis:   2 Text Interpretation:  Sinus rhythm Sinus pause Borderline low voltage, extremity leads Borderline prolonged QT interval Baseline wander in lead(s) V5 No significant change since last tracing Confirmed by Theotis Burrow [phone]) on 01/15/2017 7:51:32 AM       Radiology Dg Abdomen 1 View  Result Date: 01/15/2017 CLINICAL DATA:  Abdominal pain EXAM: ABDOMEN - 1 VIEW COMPARISON:  10/23/2016 FINDINGS: Bowel gas pattern appears similar to 10/23/16. Multiple nondilated loops of small bowel are identified. No small bowel air-fluid levels identified. There is gas and stool noted throughout the colon up to the level of the rectum. IMPRESSION: 1. Nonobstructive bowel gas pattern. Electronically Signed   By: Kerby Moors M.D.   On: 01/15/2017 09:00    Procedures Procedures (including critical care time)  Medications Ordered in ED Medications  polyethylene glycol-electrolytes (NuLYTELY/GoLYTELY) solution 4,000 mL (not administered)     Initial Impression / Assessment and Plan / ED Course  I have reviewed the triage vital signs and the nursing notes.  Pertinent labs & imaging results that were available during my care of the patient were reviewed by me and considered in my medical decision making (see chart for details).    Anthony Davis is a 82 y.o. male with history of CHF, ESRD on HD MWF, well-controlled T2 DM, HTN, HLD, OSA on CPAP, pulmonary HTN, GERD, remote history of Hep C who presents to the ED with acute and chronic abdominal pain. BMs once a week. Has tried multiple medications for  constipation in the past with no resolution of symptoms. Currently on Linzess. He is currently afebrile and hemodynamically stable and appears comfortable on exam. He is mildly tender on palpation central abdomen but otherwise abdominal exam benign. DDx: constipation, gastroparesis (but T2DM well controlled), gastritis, SBO, mesenteric ischemia (but not related to food intake and benign abdominal  exam). Lab work consistent with end stage renal disease. Low suspicion for infectious etiology as patient does not show signs or symptoms consistent with infection. Most recent CT abd/pelvis with moderate colonic stool burden in cecum and ascending colon as well as colonic diverticuli. No other abnormalities seen. KUB ordered to assess stool burden. Golytely ordered as well for constipation.   KUB showed large stool burden in cecum and ascending colon and nonobstructive bowel gas pattern. We'll discharge home with GoLYTELY as patient remains stable and has no signs or symptoms of life-threatening illnesses. He was advised drink GoLYTELY when at home and to follow-up with his PCP or referral to GI as he will need further evaluation and management of her constipation. Return precautions discussed. Patient and wife voiced understanding and are in agreement with plan.  Case discussed with Dr. Rex Kras.   Final Clinical Impressions(s) / ED Diagnoses   Final diagnoses:  Generalized abdominal pain  Other constipation    New Prescriptions New Prescriptions   No medications on file     Welford Roche, MD 01/15/17 [place], Wenda Overland, MD 01/15/17 [phone]

## 2017-01-15 NOTE — ED Triage Notes (Signed)
Pt c/o intermittent sharp mid abd pain for the past 3 weeks, worsening this morning with NV. Pt denies constipation or diarrha. Last BM 4 days ago. Pt is on HD, last treatment on Friday, due for treatment this morning.

## 2017-01-15 NOTE — Discharge Instructions (Signed)
You were seen at [facility] ED for abdominal pain that is related to constipation. Your blood tests showed changes consistent with your chronic kidney disease but no other abnormalities. Your abdomen x-ray showed a large amount of stool your colon. Please start drinking golytely when you get home. This will help move your bowels and relief abdominal pain. Do not drink during dialysis. Please follow up with your regular doctor. You will need a referral to a gastroenterologist for further workup  of constipation.

## 2017-01-15 NOTE — ED Notes (Signed)
Pt is going to dialysis tomorrow, will only take 12 ounces of golytely-- pt is on 12 ounce fluid restriction/day. Pt understands that if he feels in fluid overload he will return.  Also will take BP meds on discharge.

## 2017-01-15 NOTE — ED Notes (Signed)
Pharmacy notified of missing medication.  Patient reports will take antihypertensive medicine once at home

## 2017-02-25 ENCOUNTER — Other Ambulatory Visit: Payer: Self-pay

## 2017-02-25 ENCOUNTER — Emergency Department (HOSPITAL_COMMUNITY): Payer: Medicare Other

## 2017-02-25 ENCOUNTER — Encounter (HOSPITAL_COMMUNITY): Payer: Self-pay | Admitting: *Deleted

## 2017-02-25 ENCOUNTER — Inpatient Hospital Stay (HOSPITAL_COMMUNITY)
Admission: EM | Admit: 2017-02-25 | Discharge: 2017-02-27 | DRG: 189 | Disposition: A | Discharge status: Home / Self Care | Payer: Medicare Other | Attending: Family Medicine | Admitting: Family Medicine

## 2017-02-25 DIAGNOSIS — E7800 Pure hypercholesterolemia, unspecified: Secondary | ICD-10-CM | POA: Diagnosis present

## 2017-02-25 DIAGNOSIS — F319 Bipolar disorder, unspecified: Secondary | ICD-10-CM | POA: Diagnosis present

## 2017-02-25 DIAGNOSIS — Z8711 Personal history of peptic ulcer disease: Secondary | ICD-10-CM

## 2017-02-25 DIAGNOSIS — Z79899 Other long term (current) drug therapy: Secondary | ICD-10-CM

## 2017-02-25 DIAGNOSIS — Z992 Dependence on renal dialysis: Secondary | ICD-10-CM | POA: Diagnosis not present

## 2017-02-25 DIAGNOSIS — Z87891 Personal history of nicotine dependence: Secondary | ICD-10-CM

## 2017-02-25 DIAGNOSIS — I120 Hypertensive chronic kidney disease with stage 5 chronic kidney disease or end stage renal disease: Secondary | ICD-10-CM | POA: Diagnosis present

## 2017-02-25 DIAGNOSIS — K5900 Constipation, unspecified: Secondary | ICD-10-CM | POA: Diagnosis present

## 2017-02-25 DIAGNOSIS — R1084 Generalized abdominal pain: Secondary | ICD-10-CM

## 2017-02-25 DIAGNOSIS — E039 Hypothyroidism, unspecified: Secondary | ICD-10-CM | POA: Diagnosis present

## 2017-02-25 DIAGNOSIS — I10 Essential (primary) hypertension: Secondary | ICD-10-CM | POA: Diagnosis not present

## 2017-02-25 DIAGNOSIS — J90 Pleural effusion, not elsewhere classified: Secondary | ICD-10-CM | POA: Diagnosis present

## 2017-02-25 DIAGNOSIS — R188 Other ascites: Secondary | ICD-10-CM | POA: Diagnosis present

## 2017-02-25 DIAGNOSIS — Z96652 Presence of left artificial knee joint: Secondary | ICD-10-CM | POA: Diagnosis present

## 2017-02-25 DIAGNOSIS — N186 End stage renal disease: Secondary | ICD-10-CM | POA: Diagnosis present

## 2017-02-25 DIAGNOSIS — J810 Acute pulmonary edema: Secondary | ICD-10-CM | POA: Diagnosis not present

## 2017-02-25 DIAGNOSIS — J811 Chronic pulmonary edema: Principal | ICD-10-CM | POA: Diagnosis present

## 2017-02-25 DIAGNOSIS — J9601 Acute respiratory failure with hypoxia: Secondary | ICD-10-CM | POA: Diagnosis present

## 2017-02-25 DIAGNOSIS — J45909 Unspecified asthma, uncomplicated: Secondary | ICD-10-CM | POA: Diagnosis present

## 2017-02-25 DIAGNOSIS — I272 Pulmonary hypertension, unspecified: Secondary | ICD-10-CM | POA: Diagnosis present

## 2017-02-25 DIAGNOSIS — Z8619 Personal history of other infectious and parasitic diseases: Secondary | ICD-10-CM | POA: Diagnosis not present

## 2017-02-25 DIAGNOSIS — E1122 Type 2 diabetes mellitus with diabetic chronic kidney disease: Secondary | ICD-10-CM | POA: Diagnosis present

## 2017-02-25 DIAGNOSIS — D631 Anemia in chronic kidney disease: Secondary | ICD-10-CM | POA: Diagnosis present

## 2017-02-25 DIAGNOSIS — K219 Gastro-esophageal reflux disease without esophagitis: Secondary | ICD-10-CM | POA: Diagnosis present

## 2017-02-25 DIAGNOSIS — E8770 Fluid overload, unspecified: Secondary | ICD-10-CM | POA: Diagnosis present

## 2017-02-25 DIAGNOSIS — G4733 Obstructive sleep apnea (adult) (pediatric): Secondary | ICD-10-CM | POA: Diagnosis present

## 2017-02-25 DIAGNOSIS — Z7982 Long term (current) use of aspirin: Secondary | ICD-10-CM | POA: Diagnosis not present

## 2017-02-25 LAB — COMPREHENSIVE METABOLIC PANEL
ALK PHOS: 84 U/L (ref 38–126)
ALT: 12 U/L — AB (ref 17–63)
AST: 18 U/L (ref 15–41)
Albumin: 3.1 g/dL — ABNORMAL LOW (ref 3.5–5.0)
Anion gap: 11 (ref 5–15)
BUN: 31 mg/dL — AB (ref 6–20)
CALCIUM: 7.6 mg/dL — AB (ref 8.9–10.3)
CHLORIDE: 97 mmol/L — AB (ref 101–111)
CO2: 24 mmol/L (ref 22–32)
CREATININE: 8.46 mg/dL — AB (ref 0.61–1.24)
GFR, EST AFRICAN AMERICAN: 6 mL/min — AB (ref >60)
GFR, EST NON AFRICAN AMERICAN: 5 mL/min — AB (ref >60)
Glucose, Bld: 142 mg/dL — ABNORMAL HIGH (ref 65–99)
Potassium: 4.1 mmol/L (ref 3.5–5.1)
Sodium: 132 mmol/L — ABNORMAL LOW (ref 135–145)
Total Bilirubin: 0.9 mg/dL (ref 0.3–1.2)
Total Protein: 6.4 g/dL — ABNORMAL LOW (ref 6.5–8.1)

## 2017-02-25 LAB — CBC
HCT: 35 % — ABNORMAL LOW (ref 39.0–52.0)
HEMATOCRIT: 38.6 % — AB (ref 39.0–52.0)
Hemoglobin: 11.6 g/dL — ABNORMAL LOW (ref 13.0–17.0)
Hemoglobin: 12.9 g/dL — ABNORMAL LOW (ref 13.0–17.0)
MCH: 31.2 pg (ref 26.0–34.0)
MCH: 31.3 pg (ref 26.0–34.0)
MCHC: 33.1 g/dL (ref 30.0–36.0)
MCHC: 33.4 g/dL (ref 30.0–36.0)
MCV: 94.1 fL (ref 78.0–100.0)
MCV: 93.7 fL (ref 78.0–100.0)
PLATELETS: 144 10*3/uL — AB (ref 150–400)
Platelets: 137 10*3/uL — ABNORMAL LOW (ref 150–400)
RBC: 3.72 MIL/uL — AB (ref 4.22–5.81)
RBC: 4.12 MIL/uL — ABNORMAL LOW (ref 4.22–5.81)
RDW: 16.2 % — AB (ref 11.5–15.5)
RDW: 16 % — AB (ref 11.5–15.5)
WBC: 4.6 10*3/uL (ref 4.0–10.5)
WBC: 6.3 10*3/uL (ref 4.0–10.5)

## 2017-02-25 LAB — LIPASE, BLOOD: Lipase: 24 U/L (ref 11–51)

## 2017-02-25 LAB — MRSA PCR SCREENING: MRSA by PCR: NEGATIVE

## 2017-02-25 LAB — CREATININE, SERUM
Creatinine, Ser: 8.74 mg/dL — ABNORMAL HIGH (ref 0.61–1.24)
GFR calc Af Amer: 6 mL/min — ABNORMAL LOW (ref >60)
GFR calc non Af Amer: 5 mL/min — ABNORMAL LOW (ref >60)

## 2017-02-25 LAB — I-STAT CG4 LACTIC ACID, ED: LACTIC ACID, VENOUS: 1.25 mmol/L (ref 0.5–1.9)

## 2017-02-25 LAB — PHOSPHORUS: Phosphorus: 3.3 mg/dL (ref 2.5–4.6)

## 2017-02-25 LAB — GLUCOSE, CAPILLARY: Glucose-Capillary: 151 mg/dL — ABNORMAL HIGH (ref 65–99)

## 2017-02-25 MED ORDER — PANTOPRAZOLE SODIUM 40 MG IV SOLR
40.0000 mg | Freq: Two times a day (BID) | INTRAVENOUS | Status: DC
  Administered 2017-02-26: 40 mg via INTRAVENOUS
  Filled 2017-02-25: qty 40

## 2017-02-25 MED ORDER — ACETAMINOPHEN 650 MG RE SUPP: 650.0000 mg | Freq: Four times a day (QID) | RECTAL | Status: DC | PRN

## 2017-02-25 MED ORDER — ASPIRIN EC 81 MG PO TBEC
81.0000 mg | DELAYED_RELEASE_TABLET | Freq: Every day | ORAL | Status: DC
  Administered 2017-02-26 – 2017-02-27 (×2): 81 mg via ORAL
  Filled 2017-02-25 (×2): qty 1

## 2017-02-25 MED ORDER — ONDANSETRON HCL 4 MG PO TABS: 4.0000 mg | ORAL_TABLET | Freq: Four times a day (QID) | ORAL | Status: DC | PRN

## 2017-02-25 MED ORDER — DICYCLOMINE HCL 20 MG PO TABS: 20.0000 mg | ORAL_TABLET | Freq: Three times a day (TID) | ORAL | 0 refills | Status: DC | PRN

## 2017-02-25 MED ORDER — AMITRIPTYLINE HCL 25 MG PO TABS: 25.0000 mg | ORAL_TABLET | Freq: Every evening | ORAL | Status: DC | PRN

## 2017-02-25 MED ORDER — ONDANSETRON HCL 4 MG/2ML IJ SOLN
4.0000 mg | Freq: Once | INTRAMUSCULAR | Status: AC
  Administered 2017-02-25: 4 mg via INTRAVENOUS
  Filled 2017-02-25: qty 2

## 2017-02-25 MED ORDER — SUCRALFATE 1 G PO TABS
1.0000 g | ORAL_TABLET | Freq: Three times a day (TID) | ORAL | Status: DC
  Administered 2017-02-26 – 2017-02-27 (×6): 1 g via ORAL
  Filled 2017-02-25 (×6): qty 1

## 2017-02-25 MED ORDER — SODIUM CHLORIDE 0.9 % IV SOLN: 100.0000 mL | INTRAVENOUS | Status: DC | PRN

## 2017-02-25 MED ORDER — CARVEDILOL 12.5 MG PO TABS
12.5000 mg | ORAL_TABLET | Freq: Two times a day (BID) | ORAL | Status: DC
  Administered 2017-02-26 – 2017-02-27 (×3): 12.5 mg via ORAL
  Filled 2017-02-25 (×4): qty 1

## 2017-02-25 MED ORDER — SEVELAMER CARBONATE 800 MG PO TABS
800.0000 mg | ORAL_TABLET | Freq: Three times a day (TID) | ORAL | Status: DC
  Administered 2017-02-26 (×3): 800 mg via ORAL
  Filled 2017-02-25 (×5): qty 1

## 2017-02-25 MED ORDER — IOPAMIDOL (ISOVUE-300) INJECTION 61%
INTRAVENOUS | Status: AC
  Administered 2017-02-25: 100 mL via INTRAVENOUS
  Filled 2017-02-25: qty 100

## 2017-02-25 MED ORDER — ALTEPLASE 2 MG IJ SOLR: 2.0000 mg | Freq: Once | INTRAMUSCULAR | Status: DC | PRN

## 2017-02-25 MED ORDER — PENTAFLUOROPROP-TETRAFLUOROETH EX AERO: 1.0000 "application " | INHALATION_SPRAY | CUTANEOUS | Status: DC | PRN

## 2017-02-25 MED ORDER — ONDANSETRON HCL 4 MG/2ML IJ SOLN
4.0000 mg | Freq: Four times a day (QID) | INTRAMUSCULAR | Status: DC | PRN
  Administered 2017-02-25: 4 mg via INTRAVENOUS
  Filled 2017-02-25: qty 2

## 2017-02-25 MED ORDER — LIDOCAINE HCL (PF) 1 % IJ SOLN: 5.0000 mL | INTRAMUSCULAR | Status: DC | PRN

## 2017-02-25 MED ORDER — LOSARTAN POTASSIUM 50 MG PO TABS
100.0000 mg | ORAL_TABLET | Freq: Every day | ORAL | Status: DC
  Administered 2017-02-26: 100 mg via ORAL
  Filled 2017-02-25: qty 2

## 2017-02-25 MED ORDER — RISPERIDONE 0.5 MG PO TABS
0.5000 mg | ORAL_TABLET | Freq: Two times a day (BID) | ORAL | Status: DC
Start: 2017-02-26 — End: 2017-02-27
  Administered 2017-02-26 – 2017-02-27 (×3): 0.5 mg via ORAL
  Filled 2017-02-25 (×4): qty 1

## 2017-02-25 MED ORDER — MORPHINE SULFATE (PF) 2 MG/ML IV SOLN
1.0000 mg | INTRAVENOUS | Status: DC | PRN
Start: 2017-02-25 — End: 2017-02-27
  Administered 2017-02-25 – 2017-02-26 (×2): 1 mg via INTRAVENOUS
  Filled 2017-02-25 (×2): qty 1

## 2017-02-25 MED ORDER — LACTULOSE 10 GM/15ML PO SOLN
20.0000 g | Freq: Two times a day (BID) | ORAL | Status: DC | PRN
  Administered 2017-02-27: 20 g via ORAL
  Filled 2017-02-25: qty 30

## 2017-02-25 MED ORDER — VITAMIN D (ERGOCALCIFEROL) 1.25 MG (50000 UNIT) PO CAPS: 50000.0000 [IU] | ORAL_CAPSULE | ORAL | Status: DC

## 2017-02-25 MED ORDER — LIDOCAINE-PRILOCAINE 2.5-2.5 % EX CREA: 1.0000 "application " | TOPICAL_CREAM | CUTANEOUS | Status: DC | PRN

## 2017-02-25 MED ORDER — CALCIUM CARBONATE ANTACID 500 MG PO CHEW
1.0000 | CHEWABLE_TABLET | Freq: Three times a day (TID) | ORAL | Status: DC
  Administered 2017-02-26 – 2017-02-27 (×4): 200 mg via ORAL
  Filled 2017-02-25 (×5): qty 1

## 2017-02-25 MED ORDER — HEPARIN SODIUM (PORCINE) 5000 UNIT/ML IJ SOLN
5000.0000 [IU] | Freq: Three times a day (TID) | INTRAMUSCULAR | Status: DC
  Administered 2017-02-26 – 2017-02-27 (×5): 5000 [IU] via SUBCUTANEOUS
  Filled 2017-02-25 (×5): qty 1

## 2017-02-25 MED ORDER — METOCLOPRAMIDE HCL 5 MG PO TABS
5.0000 mg | ORAL_TABLET | Freq: Two times a day (BID) | ORAL | Status: DC
  Administered 2017-02-25 – 2017-02-27 (×4): 5 mg via ORAL
  Filled 2017-02-25 (×4): qty 1

## 2017-02-25 MED ORDER — CLONIDINE HCL 0.2 MG/24HR TD PTWK
0.2000 mg | MEDICATED_PATCH | TRANSDERMAL | Status: DC
  Administered 2017-02-27: 0.2 mg via TRANSDERMAL
  Filled 2017-02-25: qty 1

## 2017-02-25 MED ORDER — ACETAMINOPHEN 325 MG PO TABS: 650.0000 mg | ORAL_TABLET | Freq: Four times a day (QID) | ORAL | Status: DC | PRN

## 2017-02-25 MED ORDER — METOPROLOL TARTRATE 50 MG PO TABS: 50.0000 mg | ORAL_TABLET | Freq: Two times a day (BID) | ORAL | Status: DC

## 2017-02-25 MED ORDER — HYDROMORPHONE HCL 1 MG/ML IJ SOLN
0.5000 mg | Freq: Once | INTRAMUSCULAR | Status: AC
  Administered 2017-02-25: 0.5 mg via INTRAVENOUS
  Filled 2017-02-25: qty 1

## 2017-02-25 MED ORDER — LEVOTHYROXINE SODIUM 25 MCG PO TABS
25.0000 ug | ORAL_TABLET | Freq: Every day | ORAL | Status: DC
  Administered 2017-02-26 – 2017-02-27 (×2): 25 ug via ORAL
  Filled 2017-02-25 (×3): qty 1

## 2017-02-25 MED ORDER — POLYETHYLENE GLYCOL 3350 17 G PO PACK: 17.0000 g | PACK | Freq: Every day | ORAL | Status: DC | PRN

## 2017-02-25 MED ORDER — RISPERIDONE 1 MG PO TABS
1.0000 mg | ORAL_TABLET | Freq: Every day | ORAL | Status: DC
  Administered 2017-02-26: 1 mg via ORAL
  Filled 2017-02-25 (×2): qty 1

## 2017-02-25 MED ORDER — AMLODIPINE BESYLATE 10 MG PO TABS
10.0000 mg | ORAL_TABLET | Freq: Every day | ORAL | Status: DC
  Administered 2017-02-26 – 2017-02-27 (×2): 10 mg via ORAL
  Filled 2017-02-25 (×2): qty 1

## 2017-02-25 MED ORDER — PRAVASTATIN SODIUM 20 MG PO TABS
20.0000 mg | ORAL_TABLET | Freq: Every day | ORAL | Status: DC
  Administered 2017-02-26 (×2): 20 mg via ORAL
  Filled 2017-02-25 (×2): qty 1

## 2017-02-25 MED ORDER — HEPARIN SODIUM (PORCINE) 1000 UNIT/ML DIALYSIS: 1000.0000 [IU] | INTRAMUSCULAR | Status: DC | PRN

## 2017-02-25 NOTE — ED Provider Notes (Signed)
[facility] EMERGENCY DEPARTMENT Provider Note   CSN: [id number] Arrival date & time: 02/25/17  1224     History   Chief Complaint Chief Complaint  Patient presents with  . Abdominal Pain    HPI Anthony Davis is a 82 y.o. male with a history of ESRD on dialysis MWF, T2DM, hepatitis C, GERD, and constipation presents the ED with chief complaint of worsened abdominal pain for the past 1 week.  Patient describes the abdominal pain as being diffuse worse in the periumbilical region, no alleviating or aggravating factors.  He is having associated nausea without vomiting, and problems with bouts of constipation and diarrhea following lactulose-.  Patient reports that he has had problems with constant abdominal pain and constipation for the past 6 months.  Most recent ER visit was 01/15/2017, was given GoLYTELY for constipation PCP follow-up.  Was seen by gastroenterologist Dr. Holley Dexter and referred to [facility] 02/08/2017 due to an ultrasound that revealed gallbladder wall thickening of uncertain significance-at this appointment general surgery team doubted patient's symptoms to be related to the gallbladder, patient was recommended to increase fiber intake for constipation at this time.  At present patient is taking his fiber supplements daily and using lactulose as needed for constipation.  States that his last regular bowel movement was 5 days prior, took lactulose 3 days ago and had diarrhea that day, no bowel movement since.  Denies blood in stool, fever, chills, vomiting, dysuria, or back pain.  Patient's most recent dialysis appointment was Friday, patient was fully dialyzed, next dialysis appointment scheduled for tomorrow.  HPI  Past Medical History:  Diagnosis Date  . Anemia    low iron  . Arthritis   . Asthma   . Chronic kidney disease    dialysis M/W/F  . Constipation   . Diabetes mellitus without complication (HCC)    not on any medications  .  GERD (gastroesophageal reflux disease)   . Headache   . Hepatitis    Hepatitis C   . High cholesterol   . Hypertension   . Pulmonary hypertension (HCC)   . Shortness of breath dyspnea   . Sleep apnea    Got CPAP about 1 week ago, setting is five    Patient Active Problem List   Diagnosis Date Noted  . CHF exacerbation (HCC) 10/23/2016  . ESRD on dialysis (HCC) 10/23/2016  . Diabetes mellitus type 2, diet-controlled (HCC) 10/23/2016  . Anemia due to chronic kidney disease 10/23/2016  . Acute on chronic respiratory failure with hypoxia (HCC) 10/23/2016  . OSA on CPAP 10/23/2016  . HCAP (healthcare-associated pneumonia) 10/22/2016    Past Surgical History:  Procedure Laterality Date  . AV FISTULA PLACEMENT    . CATARACT EXTRACTION W/PHACO Left 03/17/2015   Procedure: Cancelled procedure;  Surgeon: Chalmers Guest, MD;  Location: [facility] OR;  Service: Ophthalmology;  Laterality: Left;  . COLONOSCOPY    . JOINT REPLACEMENT Left    knee  . PENILE PROSTHESIS IMPLANT         Home Medications    Prior to Admission medications   Medication Sig Start Date End Date Taking? Authorizing Provider  acetaminophen (TYLENOL) 500 MG tablet Take 1,000 mg by mouth every 6 (six) hours as needed for mild pain.    [provider]  amitriptyline (ELAVIL) 25 MG tablet Take 25-50 mg by mouth at bedtime as needed. 12/11/16   [provider]  amLODipine (NORVASC) 10 MG tablet Take 10 mg by mouth  daily.    [provider]  aspirin EC 81 MG EC tablet Take 1 tablet (81 mg total) by mouth daily. 10/25/16   Cristal Ford, DO  calcium carbonate (TUMS EX) 750 MG chewable tablet Chew 1 tablet by mouth 3 (three) times daily.    [provider]  carvedilol (COREG) 12.5 MG tablet Take 12.5 mg by mouth 2 (two) times daily.  07/30/16   [provider]  cloNIDine (CATAPRES - DOSED IN MG/24 HR) 0.2 mg/24hr patch Place 0.2 mg onto the skin every Tuesday.     [provider]  esomeprazole (NEXIUM) 40 MG capsule Take 40 mg by mouth 2 (two) times daily before a meal.     [provider]  levothyroxine (SYNTHROID, LEVOTHROID) 25 MCG tablet Take 25 mcg by mouth daily before breakfast. 08/27/16   [provider]  losartan (COZAAR) 100 MG tablet Take 100 mg by mouth at bedtime.     [provider]  lovastatin (MEVACOR) 20 MG tablet Take 20 mg by mouth daily with supper.     [provider]  metoprolol tartrate (LOPRESSOR) 50 MG tablet Take 50 mg by mouth 2 (two) times daily.    [provider]  polyethylene glycol (MIRALAX / GLYCOLAX) packet Take 17 g by mouth daily as needed (constipation). Mix in 8 oz liquid and drink    [provider]  risperiDONE (RISPERDAL) 0.5 MG tablet Take 0.5 mg by mouth 2 (two) times daily.     [provider]  risperiDONE (RISPERDAL) 1 MG tablet Take 1 mg by mouth daily before breakfast.     [provider]  sevelamer carbonate (RENVELA) 800 MG tablet Take 800 mg by mouth 2 (two) times daily with a meal.     [provider]  Vitamin D, Ergocalciferol, (DRISDOL) 50000 units CAPS capsule Take 50,000 Units by mouth once a week. friday 01/05/17   [provider]    Family History History reviewed. No pertinent family history.  Social History Social History   Tobacco Use  . Smoking status: Former Smoker    Last attempt to quit: 03/15/1985    Years since quitting: 31.9  . Smokeless tobacco: Never Used  Substance Use Topics  . Alcohol use: No  . Drug use: No     Allergies   Ace inhibitors; Propofol; Sulfamethoxazole; Ibuprofen; Sulfa antibiotics; and Tetracycline   Review of Systems Review of Systems  Constitutional: Negative for chills and fever.  HENT: Negative for congestion.   Eyes: Negative for visual disturbance.  Respiratory: Negative for cough and chest tightness.   Cardiovascular: Negative for chest pain, palpitations and  leg swelling.  Gastrointestinal: Positive for abdominal pain, constipation, diarrhea and nausea. Negative for blood in stool and vomiting.  Genitourinary: Negative for dysuria and flank pain.  Musculoskeletal: Negative for back pain.  Skin: Negative for rash.  Neurological: Negative for dizziness.  All other systems reviewed and are negative.    Physical Exam Updated Vital Signs BP (!) 151/85   Pulse 63   Temp 97.7 F (36.5 C) (Oral)   Resp 18   SpO2 97%   Physical Exam  Constitutional: He appears well-developed and well-nourished. No distress.  HENT:  Head: Normocephalic and atraumatic.  Eyes: Conjunctivae are normal. Right eye exhibits no discharge. Left eye exhibits no discharge.  Cardiovascular: Normal rate and regular rhythm. Exam reveals no gallop and no friction rub.  Murmur (2/6 systolic murmur heard best at RUSB) heard. Pulmonary/Chest: No tachypnea.  No respiratory distress. He has no wheezes. He has rales (bibasilar).  Abdominal: Soft. He exhibits no distension. There is tenderness (diffuse). There is no rigidity, no rebound, no guarding, no CVA tenderness, no tenderness at McBurney's point and negative Murphy's sign.  Neurological: He is alert.  Clear speech.   Skin: Skin is warm and dry. No rash noted.  Psychiatric: He has a normal mood and affect. His behavior is normal.  Nursing note and vitals reviewed.    ED Treatments / Results  Labs (all labs ordered are listed, but only abnormal results are displayed) Labs Reviewed  COMPREHENSIVE METABOLIC PANEL - Abnormal; Notable for the following components:      Result Value   Sodium 132 (*)    Chloride 97 (*)    Glucose, Bld 142 (*)    BUN 31 (*)    Creatinine, Ser 8.46 (*)    Calcium 7.6 (*)    Total Protein 6.4 (*)    Albumin 3.1 (*)    ALT 12 (*)    GFR calc non Af Amer 5 (*)    GFR calc Af Amer 6 (*)    All other components within normal limits  CBC - Abnormal; Notable for the following components:    RBC 3.72 (*)    Hemoglobin 11.6 (*)    HCT 35.0 (*)    RDW 16.2 (*)    Platelets 137 (*)    All other components within normal limits  LIPASE, BLOOD  URINALYSIS, ROUTINE W REFLEX MICROSCOPIC  I-STAT CG4 LACTIC ACID, ED    EKG  EKG Interpretation None       Radiology Ct Abdomen Pelvis W Contrast  Result Date: 02/25/2017 CLINICAL DATA:  82 y/o  M; worsening lower abdominal pain. EXAM: CT ABDOMEN AND PELVIS WITH CONTRAST TECHNIQUE: Multidetector CT imaging of the abdomen and pelvis was performed using the standard protocol following bolus administration of intravenous contrast. CONTRAST:  147mL ISOVUE-300 IOPAMIDOL (ISOVUE-300) INJECTION 61% COMPARISON:  10/23/2016 CT abdomen and pelvis. FINDINGS: Lower chest: Decreased small right larger than left pleural effusions. Cardiomegaly with prominent right heart enlargement. Small hiatal hernia. Hepatobiliary: No focal liver abnormality is seen. No gallstones or biliary ductal dilatation. Bladder is decompressed. Mild gallbladder wall thickening, likely related to ascites. Pancreas: Unremarkable. No pancreatic ductal dilatation or surrounding inflammatory changes. Spleen: Normal in size without focal abnormality. Adrenals/Urinary Tract: Mild thickening of the adrenal glands without nodule. Atrophic kidneys. Stable left kidney subcentimeter hyperdense cyst. No hydronephrosis. Collapsed bladder. Stomach/Bowel: Stomach is within normal limits. Normal appendix Mild sigmoid diverticulosis. No evidence of bowel wall thickening, distention, or inflammatory changes. Vascular/Lymphatic: Aortic atherosclerosis. No enlarged abdominal or pelvic lymph nodes. Reproductive: Penile prosthesis in the right hemipelvis. Other: Small to moderate volume of ascites and mesenteric edema. Musculoskeletal: No acute or significant osseous findings. IMPRESSION: 1. Interval development of small to moderate volume of ascites. 2. Decreased small bilateral pleural effusions. 3.  Cardiomegaly with prominent right heart enlargement and contrast reflux into liver may represent diastolic failure. 4. Sigmoid diverticulosis. 5. Small hiatal hernia. 6. Mild aortic atherosclerosis. Electronically Signed   By: Kristine Garbe M.D.   On: 02/25/2017 14:58    Procedures Procedures (including critical care time)  Medications Ordered in ED Medications  HYDROmorphone (DILAUDID) injection 0.5 mg (not administered)  HYDROmorphone (DILAUDID) injection 0.5 mg (0.5 mg Intravenous Given 02/25/17 1404)  ondansetron (ZOFRAN) injection 4 mg (4 mg Intravenous Given 02/25/17 1405)  iopamidol (ISOVUE-300) 61 % injection (100 mLs Intravenous Contrast Given  02/25/17 1435)     Initial Impression / Assessment and Plan / ED Course  I have reviewed the triage vital signs and the nursing notes.  Pertinent labs & imaging results that were available during my care of the patient were reviewed by me and considered in my medical decision making (see chart for details).   Patient presents with complaints of acute on chronic abdominal pain with associated with constipation.  He is nontoxic-appearing, in no apparent distress, with stable vitals.  DDX: constipation, small bowel obstruction, bowel perforation, mesenteric ischemia, appendicitis, diverticulitis, pancreatitis, cholecystitis, and peptic ulcer disease. Given acute worsening of patient's abdominal pain and diffuse tenderness will get a CT scan- no acute findings on CT scan, consistent with previous imaging, reviewed CT 10/2016. Screening labs consistent with patient's end-stage renal disease and baseline labs. Patient does not meet the SIRS or Sepsis criteria.  15:08: RE-EVAL: Discussed lab and imaging results with patient and his wife, provided opportunity for questions, they confirmed understanding. Patient's pain minimally improved with initial Dilaudid, will order additional 0.5mg .   16:12: RE-EVAL: Patient complaining of nausea -  Zofran ordered.   16:16: RE-EVAL: Patient received Dilaudid and Zofran. Complaining of shortness of breath and stating he feels his legs are becoming more swollen. Will order CXR and check pulse ox while ambulating the patient- if patient desaturates or has evidence of fluid overload on CXR there is potential need for nephrology consult and admission for sooner dialysis; otherwise can be discharged home with GI follow-up and dialysis tomorrow. Discussed patient case and plan with Dr. Wynelle Cleveland, signed out pending CXR and ambulation.   16:25: Patient with desaturation to 85% with trying to sit forward, Dr. Wynelle Cleveland aware.   Patient seen with supervising physician Dr. Oleta Mouse.    Final Clinical Impressions(s) / ED Diagnoses   Final diagnoses:  Generalized abdominal pain    ED Discharge Orders    None       Amaryllis Dyke, PA-C 02/25/17 1635    Forde Dandy, MD 02/26/17 [phone]

## 2017-02-25 NOTE — ED Notes (Signed)
Admitting at bedside 

## 2017-02-25 NOTE — Progress Notes (Signed)
Patient arrived to unit per bed.  Reviewed treatment plan and this RN agrees.  Report received from bedside RN, Maudie Mercury.  Consent obtained.  Patient A & O X 4. Lung sounds diminished to ausculation in all fields. Generalized non pitting edema. Cardiac: NSR.  Prepped LLAVF with alcohol and cannulated with two 15 gauge needles.  Pulsation of blood noted.  Flushed access well with saline per protocol.  Connected and secured lines and initiated tx at 2319.  UF goal of 3500 mL and net fluid removal of 3000 mL.  Will continue to monitor.

## 2017-02-25 NOTE — ED Provider Notes (Signed)
Care assumed from [facility], [place]. Please refer to her note for details of initial workup and exam.  In short patient was worked up for abdominal pain with no finding of his acute pain but was also complaining of shortness of breath and found to be hypoxic on exertion.  Pending chest x-ray at time of signout.  Chest x-ray shows signs of interstitial edema.  Discussed the patient with nephrology and the hospitalist and will be admitted for this to repeat dialysis tomorrow.  Patient amenable with this plan.  Remained hemo-dynamically stable in the emergency department.   Donaciano Range Mal[place], MD 02/25/17 0340    Elnora Morrison, MD 02/26/17 [phone]

## 2017-02-25 NOTE — Discharge Instructions (Addendum)
You were seen in the emergency department for abdominal pain.  As discussed the lab work that we did here today was consistent with your previous lab work.  Your CT scan did not show any new findings.  I have prescribed you Bentyl for your abdominal pain-you may take this every 8 hours as needed for your abdominal pain.  Follow-up with your GI doctor, Dr. Ferdinand Lango, within 3 days.  I have also provided you the contact information for another GI doctor in the area if you are unable to make an appointment with Dr. Ferdinand Lango.  Return to the emergency department for any new/concerning or worsening symptoms including but not limited to worsening abdominal pain, inability to keep down fluids, fever, blood in your stool, or blood in your vomit.  Be sure to attend your dialysis appointment tomorrow.

## 2017-02-25 NOTE — H&P (Signed)
History and Physical    Tysin Salada MXV:[id number] DOB: 01-07-1935 DOA: 02/25/2017  PCP: Caffie Damme, MD   Patient coming from: Home  Chief Complaint: Abdominal pain and dyspnea.   HPI: Anthony Davis is a 82 y.o. male with medical history significant of end-stage renal disease on hemodialysis, (Monday, Wednesday, Friday). Patient presents with acute abdominal pain that woke him up from his sleep, since this morning has been constant, sharp in nature, generalized all abdominal quadrants, there is no improving or worsening factors, it has been associated with nausea but no diarrhea. Along with his abdominal pain he has noticed dyspnea on exertion, for last 48 hours he had experienced worsening lower extremity edema and weight gain, about 2 pounds. Denies any chest pain and assures being compliant with his low salt diet. Note that he drinks daily soft drinks in the morning.   His last hemodialysis 48 hours ago apparently was uneventful.  ED Course: Patient received analgesia with hydromorphone, further workup with CT abdomen and pelvis, as well as chest x-ray, show signs of hypervolemia, he was also noted to be hypoxic on exertion, down to 80%. Nephrology was consulted, with recommendations to admit the patient to the medical ward, and plan for hemodialysis within the next 12 hours  Review of Systems:  1. General: No fevers, no chills, positive weight gain 2. ENT: No runny nose or sore throat, no hearing disturbances 3. Pulmonary: Positive dyspnea, no cough, wheezing, or hemoptysis 4. Cardiovascular: No angina, claudication, positive lower extremity edema, but no pnd or orthopnea 5. Gastrointestinal: positive nausea and vomiting, no diarrhea, last BM 3 days ago. 6. Hematology: No easy bruisability or frequent infections 7. Urology: No dysuria, hematuria or increased urinary frequency 8. Dermatology: No rashes. 9. Neurology: No seizures or paresthesias 10. Musculoskeletal: No joint pain or  deformities  Past Medical History:  Diagnosis Date  . Anemia    low iron  . Arthritis   . Asthma   . Chronic kidney disease    dialysis M/W/F  . Constipation   . Diabetes mellitus without complication (HCC)    not on any medications  . GERD (gastroesophageal reflux disease)   . Headache   . Hepatitis    Hepatitis C   . High cholesterol   . Hypertension   . Pulmonary hypertension (HCC)   . Shortness of breath dyspnea   . Sleep apnea    Got CPAP about 1 week ago, setting is five    Past Surgical History:  Procedure Laterality Date  . AV FISTULA PLACEMENT    . CATARACT EXTRACTION W/PHACO Left 03/17/2015   Procedure: Cancelled procedure;  Surgeon: Chalmers Guest, MD;  Location: [facility] OR;  Service: Ophthalmology;  Laterality: Left;  . COLONOSCOPY    . JOINT REPLACEMENT Left    knee  . PENILE PROSTHESIS IMPLANT       reports that he quit smoking about 31 years ago. he has never used smokeless tobacco. He reports that he does not drink alcohol or use drugs.  Allergies  Allergen Reactions  . Ace Inhibitors Other (See Comments)    Other reaction(s): ELEVATED CREATININE  . Propofol Other (See Comments)    Unknown reaction - reported by [facility] 09/11/16  . Sulfamethoxazole Hives  . Ibuprofen Rash  . Sulfa Antibiotics Rash  . Tetracycline Hives, Swelling and Rash    History reviewed. No pertinent family history. Family history reviewed and found to be noncontributory  Prior to Admission medications   Medication Sig Start  Date End Date Taking? Authorizing Provider  carvedilol (COREG) 12.5 MG tablet Take 12.5 mg by mouth 2 (two) times daily.  07/30/16  Yes [provider]  cloNIDine (CATAPRES - DOSED IN MG/24 HR) 0.2 mg/24hr patch Place 0.2 mg onto the skin every Tuesday.    Yes [provider]  esomeprazole (NEXIUM) 40 MG capsule Take 40 mg by mouth 2 (two) times daily before a meal.    Yes [provider]  lactulose (CHRONULAC) 10 GM/15ML  solution Take 20 g by mouth 2 (two) times daily as needed for mild constipation.   Yes [provider]  levothyroxine (SYNTHROID, LEVOTHROID) 25 MCG tablet Take 25 mcg by mouth daily before breakfast. 08/27/16  Yes [provider]  losartan (COZAAR) 100 MG tablet Take 100 mg by mouth at bedtime.    Yes [provider]  lovastatin (MEVACOR) 20 MG tablet Take 20 mg by mouth daily with supper.    Yes [provider]  metoCLOPramide (REGLAN) 5 MG tablet Take 5 mg by mouth 2 (two) times daily.   Yes [provider]  risperiDONE (RISPERDAL) 1 MG tablet Take 1 mg by mouth daily before breakfast.    Yes [provider]  sevelamer carbonate (RENVELA) 800 MG tablet Take 800 mg by mouth 3 (three) times daily with meals.    Yes [provider]  Vitamin D, Ergocalciferol, (DRISDOL) 50000 units CAPS capsule Take 50,000 Units by mouth once a week. friday 01/05/17  Yes [provider]  acetaminophen (TYLENOL) 500 MG tablet Take 1,000 mg by mouth every 6 (six) hours as needed for mild pain.    [provider]  amitriptyline (ELAVIL) 25 MG tablet Take 25-50 mg by mouth at bedtime as needed. 12/11/16   [provider]  amLODipine (NORVASC) 10 MG tablet Take 10 mg by mouth daily.    [provider]  aspirin EC 81 MG EC tablet Take 1 tablet (81 mg total) by mouth daily. 10/25/16   Edsel Petrin, DO  calcium carbonate (TUMS EX) 750 MG chewable tablet Chew 1 tablet by mouth 3 (three) times daily.    [provider]  dicyclomine (BENTYL) 20 MG tablet Take 1 tablet (20 mg total) by mouth 3 (three) times daily as needed for spasms. 02/25/17   Petrucelli, Samantha R, PA-C  metoprolol tartrate (LOPRESSOR) 50 MG tablet Take 50 mg by mouth 2 (two) times daily.    [provider]  polyethylene glycol (MIRALAX / GLYCOLAX) packet Take 17 g by mouth daily as needed (constipation). Mix in 8 oz liquid and drink     [provider]  risperiDONE (RISPERDAL) 0.5 MG tablet Take 0.5 mg by mouth 2 (two) times daily.     [provider]    Physical Exam: Vitals:   02/25/17 1607 02/25/17 1630 02/25/17 1730 02/25/17 1745  BP:  (!) 157/98 (!) 171/83 (!) 171/86  Pulse: 66 61 65 66  Resp:      Temp:      TempSrc:      SpO2: 95% 90% (!) 89% 91%    Constitutional: deconditioned Vitals:   02/25/17 1607 02/25/17 1630 02/25/17 1730 02/25/17 1745  BP:  (!) 157/98 (!) 171/83 (!) 171/86  Pulse: 66 61 65 66  Resp:      Temp:      TempSrc:      SpO2: 95% 90% (!) 89% 91%   Eyes: PERRL, lids and conjunctivae normal Head normocephalic, nose and ears with no  deformities.  ENMT: dry membranes are moist. Posterior pharynx clear of any exudate or lesions.Normal dentition.  Neck: normal, supple, no masses, no thyromegaly, no jvd Respiratory: decreased breaths sounds on auscultation bilaterally, no wheezing, mild bibasilar crackles. Normal respiratory effort. No accessory muscle use.  Cardiovascular: Regular rate and rhythm, no murmurs / rubs / gallops. Lower extremity +++ edema. 2+ pedal pulses. No carotid bruits.  Abdomen: positive tenderness to deep palpation, distended, no masses palpated. No hepatosplenomegaly. Bowel sounds positive.  Musculoskeletal: no clubbing / cyanosis. No joint deformity upper and lower extremities. Good ROM, no contractures. Normal muscle tone.  Skin: no rashes, lesions, ulcers. No induration Neurologic: CN 2-12 grossly intact. Sensation intact, DTR normal. Strength 5/5 in all 4.    Labs on Admission: I have personally reviewed following labs and imaging studies  CBC: Recent Labs  Lab 02/25/17 1239  WBC 4.6  HGB 11.6*  HCT 35.0*  MCV 94.1  PLT 355*   Basic Metabolic Panel: Recent Labs  Lab 02/25/17 1239  NA 132*  K 4.1  CL 97*  CO2 24  GLUCOSE 142*  BUN 31*  CREATININE 8.46*  CALCIUM 7.6*   GFR: CrCl cannot be calculated (Unknown ideal  weight.). Liver Function Tests: Recent Labs  Lab 02/25/17 1239  AST 18  ALT 12*  ALKPHOS 84  BILITOT 0.9  PROT 6.4*  ALBUMIN 3.1*   Recent Labs  Lab 02/25/17 1239  LIPASE 24   No results for input(s): AMMONIA in the last 168 hours. Coagulation Profile: No results for input(s): INR, PROTIME in the last 168 hours. Cardiac Enzymes: No results for input(s): CKTOTAL, CKMB, CKMBINDEX, TROPONINI in the last 168 hours. BNP (last 3 results) No results for input(s): PROBNP in the last 8760 hours. HbA1C: No results for input(s): HGBA1C in the last 72 hours. CBG: No results for input(s): GLUCAP in the last 168 hours. Lipid Profile: No results for input(s): CHOL, HDL, LDLCALC, TRIG, CHOLHDL, LDLDIRECT in the last 72 hours. Thyroid Function Tests: No results for input(s): TSH, T4TOTAL, FREET4, T3FREE, THYROIDAB in the last 72 hours. Anemia Panel: No results for input(s): VITAMINB12, FOLATE, FERRITIN, TIBC, IRON, RETICCTPCT in the last 72 hours. Urine analysis: No results found for: COLORURINE, APPEARANCEUR, LABSPEC, [place], GLUCOSEU, [place], [place], KETONESUR, PROTEINUR, UROBILINOGEN, NITRITE, LEUKOCYTESUR  Radiological Exams on Admission: Dg Chest 2 View  Result Date: 02/25/2017 CLINICAL DATA:  Chest pain and dyspnea x2 days.  Vomited. EXAM: CHEST  2 VIEW COMPARISON:  10/22/2016, chest CT 09/20/2015 FINDINGS: Stable cardiomegaly with minimal aortic atherosclerosis. Chronic mild elevation of the right hemidiaphragm. Small bilateral pleural effusions with atelectasis noted more so posteriorly. Slightly more confluent opacity projects over the dorsal spine on the lateral view believed to be superimposed effusion and atelectasis. Findings are similar to prior. Pneumonia would be difficult to entirely exclude but believed less likely. No acute osseous appearing abnormality. IMPRESSION: 1. Stable cardiomegaly with minimal aortic atherosclerosis. 2. Mild interstitial edema with small  pleural effusions and atelectasis. More confluent opacity overlying the dorsal spine on the lateral view is likely related to superimposed pleural effusion and atelectasis. Superimposed pneumonia would be difficult to entirely exclude. Clinical correlation is therefore recommended. Electronically Signed   By: Ashley Royalty M.D.   On: 02/25/2017 16:48   Ct Abdomen Pelvis W Contrast  Result Date: 02/25/2017 CLINICAL DATA:  82 y/o  M; worsening lower abdominal pain. EXAM: CT ABDOMEN AND PELVIS WITH CONTRAST TECHNIQUE: Multidetector CT imaging of the abdomen and pelvis was performed using the  standard protocol following bolus administration of intravenous contrast. CONTRAST:  188mL ISOVUE-300 IOPAMIDOL (ISOVUE-300) INJECTION 61% COMPARISON:  10/23/2016 CT abdomen and pelvis. FINDINGS: Lower chest: Decreased small right larger than left pleural effusions. Cardiomegaly with prominent right heart enlargement. Small hiatal hernia. Hepatobiliary: No focal liver abnormality is seen. No gallstones or biliary ductal dilatation. Bladder is decompressed. Mild gallbladder wall thickening, likely related to ascites. Pancreas: Unremarkable. No pancreatic ductal dilatation or surrounding inflammatory changes. Spleen: Normal in size without focal abnormality. Adrenals/Urinary Tract: Mild thickening of the adrenal glands without nodule. Atrophic kidneys. Stable left kidney subcentimeter hyperdense cyst. No hydronephrosis. Collapsed bladder. Stomach/Bowel: Stomach is within normal limits. Normal appendix Mild sigmoid diverticulosis. No evidence of bowel wall thickening, distention, or inflammatory changes. Vascular/Lymphatic: Aortic atherosclerosis. No enlarged abdominal or pelvic lymph nodes. Reproductive: Penile prosthesis in the right hemipelvis. Other: Small to moderate volume of ascites and mesenteric edema. Musculoskeletal: No acute or significant osseous findings. IMPRESSION: 1. Interval development of small to moderate volume  of ascites. 2. Decreased small bilateral pleural effusions. 3. Cardiomegaly with prominent right heart enlargement and contrast reflux into liver may represent diastolic failure. 4. Sigmoid diverticulosis. 5. Small hiatal hernia. 6. Mild aortic atherosclerosis. Electronically Signed   By: Kristine Garbe M.D.   On: 02/25/2017 14:58    EKG: Independently reviewed. NA  Assessment/Plan Active Problems:   Pulmonary edema  82 year old male who presents with abdominal pain for last 12 hours, it has been constant, associated with nausea and vomiting. His symptoms have been associated with signs of volume overload including dyspnea, weight gain and positive lower extremity edema. Questionable compliance with a renal diet. On his initial physical examination, blood pressure 171/83, heart rate 65, oxygen saturation 89% to 91% on room air. Pale conjunctivae, lungs with decreased breath sounds in bases bilaterally, heart S1-S2 present and rhythmic, abdomen distended, tender to palpation, no guarding or peritoneal signs, lower extremity with positive pitting edema +++. Sodium 132, potassium 4.1, chloride 97, bicarbonate 24, glucose 142, BUN 31, creatinine 8.46, white count 4.6, hemoglobin 11.6, hematocrit 35.0, platelets 137. CT of the abdomen with internal development of small to moderate volume ascites, small bilateral pleural effusions, sigmoid diverticulosis. Chest x-ray, cardiomegaly, hypoinflated, right rotation, positive bilateral pleural effusions, more left than right.  Working diagnosis: Abdominal pain due to worsening ascites related to volume overload due to end-stage renal disease.  1. Abdominal pain due to ascites, related to volume overload due to end-stage renal disease. Patient will be admitted to the medical ward, he will be placed on the remote telemetry monitor, will use low-dose morphine for analgesia, IV antiemetics as needed, and will add antiacid therapy. Patient will have  hemodialysis with ultrafiltration in the morning.\  2. Acute hypoxic respiratory failure due to volume overload. Pulmonary edema due to volume overload, with bilateral pleural effusions, patient does not make urine, will continue supportive medical therapy, oximetry monitoring and supplemental oxygen per nasal cannula, target saturation greater than 92%.  3. End-stage renal disease on hemodialysis. Patient does have signs of volume overload, his potassium is 4.1 and serum bicarbonate 24. No need for urgent hemodialysis but patient needs hemodialysis within the next 12 hours, nephrology has been consulted in the ED. Continue Renvela.  4. Hypertension. Will continue blood pressure control with carvedilol, clonidine, losartan, , amlodipine and metoprolol.   5. Depression. Continue risperidone and amitriptyline  6. Hypothyroidism. Continue levothyroxine  DVT prophylaxis: heparin Code Status: full  Family Communication: I spoke with patient's wife at the bedside and  all questions have been addressed  Disposition Plan: home  Consults called: Nephrology   Admission status: Inpatient.     Deari Sessler Gerome Apley MD [facility] Pager [phone]  If 7PM-7AM, please contact night-coverage [email or web address] Password [facility]  02/25/2017, 6:02 PM

## 2017-02-25 NOTE — Progress Notes (Signed)
Report attempted X 2, bedside RN not available at this time.

## 2017-02-25 NOTE — ED Notes (Signed)
This RN attempted to ambulate patient.  Patient sat up on side of bed and began to vomit clear liquid.  Pt O2 dropped to 88% just with adjusting in bed.  Did not ambulate patient, patient states he does not want to go for a walk now.  PA made aware.

## 2017-02-25 NOTE — ED Notes (Signed)
Pt provided with ginger ale.  Requesting nausea medicine.  PA aware

## 2017-02-25 NOTE — ED Provider Notes (Addendum)
Medical screening examination/treatment/procedure(s) were conducted as a shared visit with non-physician practitioner(s) and myself.  I personally evaluated the patient during the encounter.   EKG Interpretation None      82 year old male who presents with abdominal pain.  Has had intermittent abdominal discomfort for the past 6 months.  Over the past week he has had generalized abdominal pain.  Initially felt constipated and took lactulose, with subsequent diarrhea.  Has had mild nausea but this is chronic.  No melena or hematochezia, fevers or chills, vomiting.  History of end-stage renal disease on hemodialysis, and makes minimal urine.  No significant prior abdominal surgeries.  Patient well-appearing, no acute distress with unremarkable vital signs.abdomen soft and no-surgical. Generalized tenderness. Blood work overall near baseline, including baseline renal function and anemia. CT abd/pelvis performed to rule out potential appendicitis, colitis, or other serious intraabdominal processes. This is visualized and showed no acute processes. This appears to be chronic, and will refer to GI for further work-up as needed.   Prior to planned discharged, patient states he has been feeling short of breath here. Pulse ox low 90% no increased work of breathing. Some LE edema. Due to dialysis tomorrow and suspect possible fluid overload. Will obtain CXR and ambulate. If some degree of hypoxia or significant fluid overload, will plan on admission for dialysis    Forde Dandy, MD 02/25/17 1555    Forde Dandy, MD 02/26/17 [phone]

## 2017-02-25 NOTE — ED Notes (Signed)
Patient transported to CT 

## 2017-02-25 NOTE — ED Notes (Signed)
Pt back from x-ray.

## 2017-02-25 NOTE — Consult Note (Signed)
Reason for Consult: To manage dialysis and dialysis related needs Referring Physician: Dr. Wynelle Cleveland, EDP  Anthony Davis is an 82 y.o. male.  HPI: Pt is a 59M with a PMH significant for ESRD on MWF At [facility], Hep C, HTN, pHTN, and DM who is now seen in consultation at the request of Dr. Wynelle Cleveland for management of ESRD and provision of HD.    Pt actually presented to ED for evaluation of abd pain.  He reports that he's had cramping abd pain for 1 month or so.  Gags often.  Has only 1-2 stools a week and takes lactulose as needed.  A CT of the abd was negative for acute pathology, LFTs WNL, lactate WNL; however, he was found to have hypoxia and hypertension into the 190s-200s.  He notes that he has missed no dialysis sessions.  He last left at 182 lbs which he thinks is his EDW.  He has been eating less due to his abd complaints.  Dialyzes at [facility] and records are unavailable at this time.  Past Medical History:  Diagnosis Date  . Anemia    low iron  . Arthritis   . Asthma   . Chronic kidney disease    dialysis M/W/F  . Constipation   . Diabetes mellitus without complication ([place])    not on any medications  . GERD (gastroesophageal reflux disease)   . Headache   . Hepatitis    Hepatitis C   . High cholesterol   . Hypertension   . Pulmonary hypertension ([place])   . Shortness of breath dyspnea   . Sleep apnea    Got CPAP about 1 week ago, setting is five    Past Surgical History:  Procedure Laterality Date  . AV FISTULA PLACEMENT    . CATARACT EXTRACTION W/PHACO Left 03/17/2015   Procedure: Cancelled procedure;  Surgeon: Marylynn Pearson, MD;  Location: [facility];  Service: Ophthalmology;  Laterality: Left;  . COLONOSCOPY    . JOINT REPLACEMENT Left    knee  . PENILE PROSTHESIS IMPLANT      History reviewed. No pertinent family history.  Social History:  reports that he quit smoking about 31 years ago. he has never used smokeless tobacco. He reports that he does not drink  alcohol or use drugs.  Allergies:  Allergies  Allergen Reactions  . Ace Inhibitors Other (See Comments)    Other reaction(s): ELEVATED CREATININE  . Propofol Other (See Comments)    Unknown reaction - reported by [facility] 09/11/16  . Sulfamethoxazole Hives  . Ibuprofen Rash  . Sulfa Antibiotics Rash  . Tetracycline Hives, Swelling and Rash    Medications: I have reviewed his current medications.   Results for orders placed or performed during the hospital encounter of 02/25/17 (from the past 48 hour(s))  Lipase, blood     Status: None   Collection Time: 02/25/17 12:39 PM  Result Value Ref Range   Lipase 24 11 - 51 U/L  Comprehensive metabolic panel     Status: Abnormal   Collection Time: 02/25/17 12:39 PM  Result Value Ref Range   Sodium 132 (L) 135 - 145 mmol/L   Potassium 4.1 3.5 - 5.1 mmol/L   Chloride 97 (L) 101 - 111 mmol/L   CO2 24 22 - 32 mmol/L   Glucose, Bld 142 (H) 65 - 99 mg/dL   BUN 31 (H) 6 - 20 mg/dL   Creatinine, Ser 8.46 (H) 0.61 - 1.24 mg/dL   Calcium  7.6 (L) 8.9 - 10.3 mg/dL   Total Protein 6.4 (L) 6.5 - 8.1 g/dL   Albumin 3.1 (L) 3.5 - 5.0 g/dL   AST 18 15 - 41 U/L   ALT 12 (L) 17 - 63 U/L   Alkaline Phosphatase 84 38 - 126 U/L   Total Bilirubin 0.9 0.3 - 1.2 mg/dL   GFR calc non Af Amer 5 (L) >60 mL/min   GFR calc Af Amer 6 (L) >60 mL/min    Comment: (NOTE) The eGFR has been calculated using the CKD EPI equation. This calculation has not been validated in all clinical situations. eGFR's persistently <60 mL/min signify possible Chronic Kidney Disease.    Anion gap 11 5 - 15  CBC     Status: Abnormal   Collection Time: 02/25/17 12:39 PM  Result Value Ref Range   WBC 4.6 4.0 - 10.5 K/uL   RBC 3.72 (L) 4.22 - 5.81 MIL/uL   Hemoglobin 11.6 (L) 13.0 - 17.0 g/dL   HCT 35.0 (L) 39.0 - 52.0 %   MCV 94.1 78.0 - 100.0 fL   MCH 31.2 26.0 - 34.0 pg   MCHC 33.1 30.0 - 36.0 g/dL   RDW 16.2 (H) 11.5 - 15.5 %   Platelets 137 (L) 150 - 400 K/uL   I-Stat CG4 Lactic Acid, ED     Status: None   Collection Time: 02/25/17  1:50 PM  Result Value Ref Range   Lactic Acid, Venous 1.25 0.5 - 1.9 mmol/L    Dg Chest 2 View  Result Date: 02/25/2017 CLINICAL DATA:  Chest pain and dyspnea x2 days.  Vomited. EXAM: CHEST  2 VIEW COMPARISON:  10/22/2016, chest CT 09/20/2015 FINDINGS: Stable cardiomegaly with minimal aortic atherosclerosis. Chronic mild elevation of the right hemidiaphragm. Small bilateral pleural effusions with atelectasis noted more so posteriorly. Slightly more confluent opacity projects over the dorsal spine on the lateral view believed to be superimposed effusion and atelectasis. Findings are similar to prior. Pneumonia would be difficult to entirely exclude but believed less likely. No acute osseous appearing abnormality. IMPRESSION: 1. Stable cardiomegaly with minimal aortic atherosclerosis. 2. Mild interstitial edema with small pleural effusions and atelectasis. More confluent opacity overlying the dorsal spine on the lateral view is likely related to superimposed pleural effusion and atelectasis. Superimposed pneumonia would be difficult to entirely exclude. Clinical correlation is therefore recommended. Electronically Signed   By: Ashley Royalty M.D.   On: 02/25/2017 16:48   Ct Abdomen Pelvis W Contrast  Result Date: 02/25/2017 CLINICAL DATA:  82 y/o  M; worsening lower abdominal pain. EXAM: CT ABDOMEN AND PELVIS WITH CONTRAST TECHNIQUE: Multidetector CT imaging of the abdomen and pelvis was performed using the standard protocol following bolus administration of intravenous contrast. CONTRAST:  137m ISOVUE-300 IOPAMIDOL (ISOVUE-300) INJECTION 61% COMPARISON:  10/23/2016 CT abdomen and pelvis. FINDINGS: Lower chest: Decreased small right larger than left pleural effusions. Cardiomegaly with prominent right heart enlargement. Small hiatal hernia. Hepatobiliary: No focal liver abnormality is seen. No gallstones or biliary ductal  dilatation. Bladder is decompressed. Mild gallbladder wall thickening, likely related to ascites. Pancreas: Unremarkable. No pancreatic ductal dilatation or surrounding inflammatory changes. Spleen: Normal in size without focal abnormality. Adrenals/Urinary Tract: Mild thickening of the adrenal glands without nodule. Atrophic kidneys. Stable left kidney subcentimeter hyperdense cyst. No hydronephrosis. Collapsed bladder. Stomach/Bowel: Stomach is within normal limits. Normal appendix Mild sigmoid diverticulosis. No evidence of bowel wall thickening, distention, or inflammatory changes. Vascular/Lymphatic: Aortic atherosclerosis. No enlarged abdominal or pelvic lymph nodes.  Reproductive: Penile prosthesis in the right hemipelvis. Other: Small to moderate volume of ascites and mesenteric edema. Musculoskeletal: No acute or significant osseous findings. IMPRESSION: 1. Interval development of small to moderate volume of ascites. 2. Decreased small bilateral pleural effusions. 3. Cardiomegaly with prominent right heart enlargement and contrast reflux into liver may represent diastolic failure. 4. Sigmoid diverticulosis. 5. Small hiatal hernia. 6. Mild aortic atherosclerosis. Electronically Signed   By: Kristine Garbe M.D.   On: 02/25/2017 14:58    ROS: + for n/v as noted above Blood pressure (!) 171/86, pulse 66, temperature 97.7 F (36.5 C), temperature source Oral, resp. rate 18, SpO2 91 %. .  GEN: older gentleman, intermittently gagging/ dry heaving HEENT EOMI, PERRL, MMM NECK + JVD to angle of mandible PULM slightly tachypneic, inspiratory bibasilar rhonchi CV RRR + S3 ABD soft, nontender, nondistended, NABS EXT 2+ LE edema NEURO  AAO x 3 SKIN + scorpion tattoo on the back of L hand ACCESS: L FA AVF with good thrill and bruit, +tortuous without frank aneurysmal areas  Assessment/Plan: 1 Abd pain/ nausea: CT without acute pathology, LFTs WNL, had an abd [place] at [facility] without acute gallbladder  pathology 11/8.  Doesn't appear to have had scopes yet. Per primary 2 ESRD: MWF at [facility].  Will provide HD tonight (Sunday) and will get back on schedule tomorrow.  I expect that he will need his EDW lowered based on what pt is telling me his EDW is.  Will get records from [facility]. 3 Hypertension: Expect to improve with UF and resumption of home medications  4. Anemia of ESRD: Hgb 11.6, will get records for ESA when appropriate 5. Metabolic Bone Disease: adding on phos; binders when can eat 6. Nutrition: albumin 3.1, supps when can eat (NPO now)  Madelon Lips 02/25/2017, 6:52 PM

## 2017-02-25 NOTE — Progress Notes (Signed)
Report received from 30M bedside RN.

## 2017-02-25 NOTE — Progress Notes (Signed)
Report attempted per HD.  Bedside RN to return call when able.

## 2017-02-25 NOTE — ED Triage Notes (Signed)
Pt reports mid to lower abd pain for months but it is getting progressively worse. Having nausea and ongoing diarrhea, last bm was 4-5 days ago. Dialysis pt, last treatment was Friday. Denies dark stools.

## 2017-02-26 ENCOUNTER — Other Ambulatory Visit: Payer: Self-pay

## 2017-02-26 ENCOUNTER — Encounter (HOSPITAL_COMMUNITY): Payer: Self-pay | Admitting: *Deleted

## 2017-02-26 DIAGNOSIS — R1084 Generalized abdominal pain: Secondary | ICD-10-CM

## 2017-02-26 LAB — CBC
HEMATOCRIT: 34.9 % — AB (ref 39.0–52.0)
Hemoglobin: 11.8 g/dL — ABNORMAL LOW (ref 13.0–17.0)
MCH: 31.1 pg (ref 26.0–34.0)
MCHC: 33.8 g/dL (ref 30.0–36.0)
MCV: 92.1 fL (ref 78.0–100.0)
PLATELETS: 142 10*3/uL — AB (ref 150–400)
RBC: 3.79 MIL/uL — AB (ref 4.22–5.81)
RDW: 15.7 % — ABNORMAL HIGH (ref 11.5–15.5)
WBC: 6.8 10*3/uL (ref 4.0–10.5)

## 2017-02-26 LAB — COMPREHENSIVE METABOLIC PANEL
ALT: 12 U/L — ABNORMAL LOW (ref 17–63)
AST: 18 U/L (ref 15–41)
Albumin: 3.1 g/dL — ABNORMAL LOW (ref 3.5–5.0)
Alkaline Phosphatase: 79 U/L (ref 38–126)
Anion gap: 11 (ref 5–15)
BILIRUBIN TOTAL: 0.9 mg/dL (ref 0.3–1.2)
BUN: 17 mg/dL (ref 6–20)
CO2: 26 mmol/L (ref 22–32)
Calcium: 7.8 mg/dL — ABNORMAL LOW (ref 8.9–10.3)
Chloride: 96 mmol/L — ABNORMAL LOW (ref 101–111)
Creatinine, Ser: 5.56 mg/dL — ABNORMAL HIGH (ref 0.61–1.24)
GFR, EST AFRICAN AMERICAN: 10 mL/min — AB (ref >60)
GFR, EST NON AFRICAN AMERICAN: 9 mL/min — AB (ref >60)
Glucose, Bld: 120 mg/dL — ABNORMAL HIGH (ref 65–99)
POTASSIUM: 3.8 mmol/L (ref 3.5–5.1)
Sodium: 133 mmol/L — ABNORMAL LOW (ref 135–145)
TOTAL PROTEIN: 6.6 g/dL (ref 6.5–8.1)

## 2017-02-26 LAB — GLUCOSE, CAPILLARY: GLUCOSE-CAPILLARY: 104 mg/dL — AB (ref 65–99)

## 2017-02-26 LAB — HEPATITIS B SURFACE ANTIGEN: Hepatitis B Surface Ag: NEGATIVE

## 2017-02-26 MED ORDER — PANTOPRAZOLE SODIUM 40 MG PO TBEC
40.0000 mg | DELAYED_RELEASE_TABLET | Freq: Two times a day (BID) | ORAL | Status: DC
  Administered 2017-02-26 – 2017-02-27 (×3): 40 mg via ORAL
  Filled 2017-02-26 (×3): qty 1

## 2017-02-26 NOTE — Progress Notes (Signed)
Dialysis treatment completed.  3500 mL ultrafiltrated and net fluid removal 3000 mL.    Patient status unchanged. Lung sounds diminished to ausculation in all fields. Generalized edema. Cardiac: NSR.  Disconnected lines and removed needles.  Pressure held for 10 minutes and band aid/gauze dressing applied.  Report given to bedside RN, Maudie Mercury.

## 2017-02-26 NOTE — Progress Notes (Addendum)
PROGRESS NOTE    Anthony Davis  MOQ:[id number] DOB: [birth year] DOA: 02/25/2017 PCP: Glendon Axe, MD   Specialists:     Brief Narrative:  82 year old male ESRD MWF [facility] HTN Diabetes mellitus type 2 Obstructive sleep apnea on CPAP-on settings of 2--on chr oxygen for the past 2 years Remote history of hepatitis C-rx HArvoni ~ 06/2016 [name]  Hyperlipidemia  Prior most recent admission 10/2016 acute hypoxic respiratory failure secondary probably to volume overload  States he has had about 6 months of abdominal pain unknown etiology was referred by the emergency room to [facility] who saw the patient in consult I did not recommend any further management and thought best for him to treat constipation aggressively and continue laxative such as lactulose  Returns to the emergency room with constipation and abdominal pain ultrasound 11/8 showed thickened gallbladder-no melena no fever no chills  CT scan showed interval development of moderate to small volume ascites small bilateral effusions and cardiomegaly with right heart enlargement no evidence of any gallbladder pathology   Assessment & Plan:   Active Problems:   Pulmonary edema   Abdominal pain Probably secondary to ascites-gallbladder does not show any acute pathology-LFTs are within normal limits Sounds predomnantly like food pain relationship raising concern for PUD--he has had per his report colonoscopy and endoscopy in the past cpl of years which wer e"normal" per him --no alarm sympt-need outpatient follow-up with gastroenterology Increase from CLD to soft diet today and observe Cont carafate and reglan Okay to continue lactulose 20 twice daily and would use scheduled  ESRD Monday Wednesday Friday [facility]-EDW = Metabolic bone disease Anemia of renal disease Defer to nephrology further management-hemoglobin stores are fine Phosphorus good at 3.3  Acute hypoxic failure secondary to  small bilateral pleural effusions Continue dialysis  HTN Continue amlodipine 10, Coreg 12.5 twice daily, clonidine patch 0.2 q. 7 days, losartan 100  Hyperlipidemia continue Pravachol 20 daily  Reflux Continue Protonix 40 IV every 12, Carafate 1 g 3 times daily Will trial Reglan to see if this helps 5 mg twice daily but may need to discontinue  Bipolar continue amitriptyline 25 at bedtime, risperidone 0.5 twice daily  Hypothyroidism Continue Synthroid 25 mcg daily  DVT prophylaxis: lovenox Code Status: presumed full Family Communication: none Disposition Plan:  inpatient   Consultants:   none  Procedures:   none  Antimicrobials:   none    Subjective: Awake alert ion nad P[ain in abd is better-tolsome po No cp No fever no chills no vomit no dark nor tarrystool;, no swelling no chills no rigor  Objective: Vitals:   02/26/17 0249 02/26/17 0251 02/26/17 0326 02/26/17 0500  BP: (!) 173/89 (!) 177/98 (!) 169/84   Pulse: 71 64 76   Resp: 14  16   Temp: 97.9 F (36.6 C)  97.7 F (36.5 C)   TempSrc:      SpO2:   100%   Weight: 84.1 kg (185 lb 6.5 oz)     Height:    '5\' 11"'$  (1.803 m)    Intake/Output Summary (Last 24 hours) at 02/26/2017 0812 Last data filed at 02/26/2017 0600 Gross per 24 hour  Intake 240 ml  Output 3000 ml  Net -2760 ml   Filed Weights   02/25/17 2302 02/26/17 0249  Weight: 87.1 kg (192 lb 0.3 oz) 84.1 kg (185 lb 6.5 oz)    Examination:  Alert pleaant in nad No ict no pallor s1 s 2no m/r/g cta b no added  sound No le edema abd slight swollen but no rebound, no HSM No asterixis   Data Reviewed: I have personally reviewed following labs and imaging studies  CBC: Recent Labs  Lab 02/25/17 1239 02/25/17 2208 02/26/17 0443  WBC 4.6 6.3 6.8  HGB 11.6* 12.9* 11.8*  HCT 35.0* 38.6* 34.9*  MCV 94.1 93.7 92.1  PLT 137* 144* 498*   Basic Metabolic Panel: Recent Labs  Lab 02/25/17 1239 02/25/17 2208 02/26/17 0443  NA 132*   --  133*  K 4.1  --  3.8  CL 97*  --  96*  CO2 24  --  26  GLUCOSE 142*  --  120*  BUN 31*  --  17  CREATININE 8.46* 8.74* 5.56*  CALCIUM 7.6*  --  7.8*  PHOS  --  3.3  --    GFR: Estimated Creatinine Clearance: 11.5 mL/min (A) (by C-G formula based on SCr of 5.56 mg/dL (H)). Liver Function Tests: Recent Labs  Lab 02/25/17 1239 02/26/17 0443  AST 18 18  ALT 12* 12*  ALKPHOS 84 79  BILITOT 0.9 0.9  PROT 6.4* 6.6  ALBUMIN 3.1* 3.1*   Recent Labs  Lab 02/25/17 1239  LIPASE 24   No results for input(s): AMMONIA in the last 168 hours. Coagulation Profile: No results for input(s): INR, PROTIME in the last 168 hours. Cardiac Enzymes: No results for input(s): CKTOTAL, CKMB, CKMBINDEX, TROPONINI in the last 168 hours. BNP (last 3 results) No results for input(s): PROBNP in the last 8760 hours. HbA1C: No results for input(s): HGBA1C in the last 72 hours. CBG: Recent Labs  Lab 02/25/17 2143 02/26/17 0325  GLUCAP 151* 104*   Lipid Profile: No results for input(s): CHOL, HDL, LDLCALC, TRIG, CHOLHDL, LDLDIRECT in the last 72 hours. Thyroid Function Tests: No results for input(s): TSH, T4TOTAL, FREET4, T3FREE, THYROIDAB in the last 72 hours. Anemia Panel: No results for input(s): VITAMINB12, FOLATE, FERRITIN, TIBC, IRON, RETICCTPCT in the last 72 hours. Urine analysis: No results found for: COLORURINE, APPEARANCEUR, LABSPEC, [place], GLUCOSEU, [place], [place], [place], [place], [place], NITRITE, [place]   Radiology Studies: Reviewed images personally in health database    Scheduled Meds: . amLODipine  10 mg Oral Daily  . aspirin EC  81 mg Oral Daily  . calcium carbonate  1 tablet Oral TID WC  . carvedilol  12.5 mg Oral BID WC  . [START ON 02/27/2017] cloNIDine  0.2 mg Transdermal Q Tue  . heparin  5,000 Units Subcutaneous Q8H  . levothyroxine  25 mcg Oral QAC breakfast  . losartan  100 mg Oral QHS  . metoCLOPramide  5 mg Oral BID  . pantoprazole  (PROTONIX) IV  40 mg Intravenous Q12H  . pravastatin  20 mg Oral q1800  . risperiDONE  0.5 mg Oral BID AC  . risperiDONE  1 mg Oral QAC breakfast  . sevelamer carbonate  800 mg Oral TID WC  . sucralfate  1 g Oral TID WC & HS  . [START ON 03/02/2017] Vitamin D (Ergocalciferol)  50,000 Units Oral Weekly   Continuous Infusions: . sodium chloride    . sodium chloride       LOS: 1 day    Time spent: [place], MD [facility]   If 7PM-7AM, please contact night-coverage [email or web address] Password [email or web address] 02/26/2017, 8:12 AM

## 2017-02-26 NOTE — Plan of Care (Signed)
  Progressing Education: Knowledge of General Education information will improve 02/26/2017 0958 - Progressing by Harlin Heys, RN Health Behavior/Discharge Planning: Ability to manage health-related needs will improve 02/26/2017 0958 - Progressing by Harlin Heys, RN Clinical Measurements: Ability to maintain clinical measurements within normal limits will improve 02/26/2017 0958 - Progressing by Harlin Heys, RN Will remain free from infection 02/26/2017 0958 - Progressing by Harlin Heys, RN Diagnostic test results will improve 02/26/2017 0958 - Progressing by Harlin Heys, RN Respiratory complications will improve 02/26/2017 0958 - Progressing by Harlin Heys, RN Cardiovascular complication will be avoided 02/26/2017 0958 - Progressing by Harlin Heys, RN Activity: Risk for activity intolerance will decrease 02/26/2017 0958 - Progressing by Harlin Heys, RN Nutrition: Adequate nutrition will be maintained 02/26/2017 0958 - Progressing by Harlin Heys, RN Coping: Level of anxiety will decrease 02/26/2017 0958 - Progressing by Harlin Heys, RN Elimination: Will not experience complications related to bowel motility 02/26/2017 0958 - Progressing by Harlin Heys, RN Will not experience complications related to urinary retention 02/26/2017 0958 - Progressing by Harlin Heys, RN Safety: Ability to remain free from injury will improve 02/26/2017 0958 - Progressing by Harlin Heys, RN Skin Integrity: Risk for impaired skin integrity will decrease 02/26/2017 0958 - Progressing by Harlin Heys, RN

## 2017-02-26 NOTE — Progress Notes (Signed)
Pt reports that he wears a CPAP at night. Page to K. Schorr for notification. Dorthey Sawyer, RN

## 2017-02-26 NOTE — Progress Notes (Signed)
Assessment/Plan: 1 Abd pain/ nausea: Improved 2 ESRD: MWF at [facility].  -3500cc removed with HD and now feels better. OK for d/c from renal perspective when other probs ok  3 Hypertension: Improved but not at goal 4 Nocturnal home O2/CPAP   Subjective: Interval History: Feels better after HD  Objective: Vital signs in last 24 hours: Temp:  [97.4 F (36.3 C)-98 F (36.7 C)] 97.4 F (36.3 C) (11/26 0913) Pulse Rate:  [61-76] 69 (11/26 0913) Resp:  [13-16] 16 (11/26 0913) BP: (151-201)/(72-114) 188/86 (11/26 0913) SpO2:  [89 %-100 %] 99 % (11/26 0913) Weight:  [84.1 kg (185 lb 6.5 oz)-87.1 kg (192 lb 0.3 oz)] 84.1 kg (185 lb 6.5 oz) (11/26 0249) Weight change:   Intake/Output from previous day: 11/25 0701 - 11/26 0700 In: 240 [P.O.:240] Out: 3000  Intake/Output this shift: Total I/O In: 532 [P.O.:532] Out: 0   General appearance: alert and cooperative Resp: clear to auscultation bilaterally Chest wall: no tenderness Cardio: regular rate and rhythm, S1, S2 normal, no murmur, click, rub or gallop GI: soft, non-tender; bowel sounds normal; no masses,  no organomegaly Extremities: LUE AVF ok  Lab Results: Recent Labs    02/25/17 2208 02/26/17 0443  WBC 6.3 6.8  HGB 12.9* 11.8*  HCT 38.6* 34.9*  PLT 144* 142*   BMET:  Recent Labs    02/25/17 1239 02/25/17 2208 02/26/17 0443  NA 132*  --  133*  K 4.1  --  3.8  CL 97*  --  96*  CO2 24  --  26  GLUCOSE 142*  --  120*  BUN 31*  --  17  CREATININE 8.46* 8.74* 5.56*  CALCIUM 7.6*  --  7.8*   No results for input(s): PTH in the last 72 hours. Iron Studies: No results for input(s): IRON, TIBC, TRANSFERRIN, FERRITIN in the last 72 hours. Studies/Results: Dg Chest 2 View  Result Date: 02/25/2017 CLINICAL DATA:  Chest pain and dyspnea x2 days.  Vomited. EXAM: CHEST  2 VIEW COMPARISON:  10/22/2016, chest CT 09/20/2015 FINDINGS: Stable cardiomegaly with minimal aortic atherosclerosis. Chronic mild elevation of  the right hemidiaphragm. Small bilateral pleural effusions with atelectasis noted more so posteriorly. Slightly more confluent opacity projects over the dorsal spine on the lateral view believed to be superimposed effusion and atelectasis. Findings are similar to prior. Pneumonia would be difficult to entirely exclude but believed less likely. No acute osseous appearing abnormality. IMPRESSION: 1. Stable cardiomegaly with minimal aortic atherosclerosis. 2. Mild interstitial edema with small pleural effusions and atelectasis. More confluent opacity overlying the dorsal spine on the lateral view is likely related to superimposed pleural effusion and atelectasis. Superimposed pneumonia would be difficult to entirely exclude. Clinical correlation is therefore recommended. Electronically Signed   By: Ashley Royalty M.D.   On: 02/25/2017 16:48   Ct Abdomen Pelvis W Contrast  Result Date: 02/25/2017 CLINICAL DATA:  82 y/o  M; worsening lower abdominal pain. EXAM: CT ABDOMEN AND PELVIS WITH CONTRAST TECHNIQUE: Multidetector CT imaging of the abdomen and pelvis was performed using the standard protocol following bolus administration of intravenous contrast. CONTRAST:  135mL ISOVUE-300 IOPAMIDOL (ISOVUE-300) INJECTION 61% COMPARISON:  10/23/2016 CT abdomen and pelvis. FINDINGS: Lower chest: Decreased small right larger than left pleural effusions. Cardiomegaly with prominent right heart enlargement. Small hiatal hernia. Hepatobiliary: No focal liver abnormality is seen. No gallstones or biliary ductal dilatation. Bladder is decompressed. Mild gallbladder wall thickening, likely related to ascites. Pancreas: Unremarkable. No pancreatic ductal dilatation or surrounding  inflammatory changes. Spleen: Normal in size without focal abnormality. Adrenals/Urinary Tract: Mild thickening of the adrenal glands without nodule. Atrophic kidneys. Stable left kidney subcentimeter hyperdense cyst. No hydronephrosis. Collapsed bladder.  Stomach/Bowel: Stomach is within normal limits. Normal appendix Mild sigmoid diverticulosis. No evidence of bowel wall thickening, distention, or inflammatory changes. Vascular/Lymphatic: Aortic atherosclerosis. No enlarged abdominal or pelvic lymph nodes. Reproductive: Penile prosthesis in the right hemipelvis. Other: Small to moderate volume of ascites and mesenteric edema. Musculoskeletal: No acute or significant osseous findings. IMPRESSION: 1. Interval development of small to moderate volume of ascites. 2. Decreased small bilateral pleural effusions. 3. Cardiomegaly with prominent right heart enlargement and contrast reflux into liver may represent diastolic failure. 4. Sigmoid diverticulosis. 5. Small hiatal hernia. 6. Mild aortic atherosclerosis. Electronically Signed   By: Kristine Garbe M.D.   On: 02/25/2017 14:58    Scheduled: . amLODipine  10 mg Oral Daily  . aspirin EC  81 mg Oral Daily  . calcium carbonate  1 tablet Oral TID WC  . carvedilol  12.5 mg Oral BID WC  . [START ON 02/27/2017] cloNIDine  0.2 mg Transdermal Q Tue  . heparin  5,000 Units Subcutaneous Q8H  . levothyroxine  25 mcg Oral QAC breakfast  . losartan  100 mg Oral QHS  . metoCLOPramide  5 mg Oral BID  . pantoprazole  40 mg Oral BID  . pravastatin  20 mg Oral q1800  . risperiDONE  0.5 mg Oral BID AC  . risperiDONE  1 mg Oral QAC breakfast  . sevelamer carbonate  800 mg Oral TID WC  . sucralfate  1 g Oral TID WC & HS  . [START ON 03/02/2017] Vitamin D (Ergocalciferol)  50,000 Units Oral Weekly       LOS: 1 day   Estanislado Emms 02/26/2017,1:27 PM

## 2017-02-27 LAB — CBC WITH DIFFERENTIAL/PLATELET
BASOS ABS: 0 10*3/uL (ref 0.0–0.1)
Basophils Relative: 0 %
Eosinophils Absolute: 0.1 10*3/uL (ref 0.0–0.7)
Eosinophils Relative: 3 %
HEMATOCRIT: 33.9 % — AB (ref 39.0–52.0)
HEMOGLOBIN: 11 g/dL — AB (ref 13.0–17.0)
LYMPHS ABS: 0.7 10*3/uL (ref 0.7–4.0)
LYMPHS PCT: 14 %
MCH: 30.6 pg (ref 26.0–34.0)
MCHC: 32.4 g/dL (ref 30.0–36.0)
MCV: 94.4 fL (ref 78.0–100.0)
Monocytes Absolute: 0.4 10*3/uL (ref 0.1–1.0)
Monocytes Relative: 7 %
NEUTROS ABS: 3.9 10*3/uL (ref 1.7–7.7)
NEUTROS PCT: 76 %
PLATELETS: 166 10*3/uL (ref 150–400)
RBC: 3.59 MIL/uL — AB (ref 4.22–5.81)
RDW: 16.2 % — ABNORMAL HIGH (ref 11.5–15.5)
WBC: 5.1 10*3/uL (ref 4.0–10.5)

## 2017-02-27 LAB — COMPREHENSIVE METABOLIC PANEL
ALBUMIN: 3 g/dL — AB (ref 3.5–5.0)
ALT: 8 U/L — ABNORMAL LOW (ref 17–63)
ANION GAP: 10 (ref 5–15)
AST: 14 U/L — ABNORMAL LOW (ref 15–41)
Alkaline Phosphatase: 72 U/L (ref 38–126)
BUN: 23 mg/dL — ABNORMAL HIGH (ref 6–20)
CHLORIDE: 93 mmol/L — AB (ref 101–111)
CO2: 28 mmol/L (ref 22–32)
Calcium: 7.8 mg/dL — ABNORMAL LOW (ref 8.9–10.3)
Creatinine, Ser: 7.01 mg/dL — ABNORMAL HIGH (ref 0.61–1.24)
GFR calc non Af Amer: 7 mL/min — ABNORMAL LOW (ref >60)
GFR, EST AFRICAN AMERICAN: 8 mL/min — AB (ref >60)
GLUCOSE: 99 mg/dL (ref 65–99)
Potassium: 4.4 mmol/L (ref 3.5–5.1)
SODIUM: 131 mmol/L — AB (ref 135–145)
Total Bilirubin: 0.6 mg/dL (ref 0.3–1.2)
Total Protein: 6.2 g/dL — ABNORMAL LOW (ref 6.5–8.1)

## 2017-02-27 LAB — HEPATITIS B SURFACE ANTIBODY,QUALITATIVE: Hep B S Ab: REACTIVE

## 2017-02-27 LAB — PROTIME-INR
INR: 1.22
Prothrombin Time: 15.3 seconds — ABNORMAL HIGH (ref 11.4–15.2)

## 2017-02-27 LAB — HEPATITIS B CORE ANTIBODY, TOTAL: HEP B C TOTAL AB: POSITIVE — AB

## 2017-02-27 MED ORDER — CALCIUM CARBONATE ANTACID 500 MG PO CHEW: 1.0000 | CHEWABLE_TABLET | Freq: Three times a day (TID) | ORAL | 0 refills | Status: AC

## 2017-02-27 MED ORDER — RISPERIDONE 0.5 MG PO TABS
0.5000 mg | ORAL_TABLET | Freq: Two times a day (BID) | ORAL | 0 refills | Status: DC
Start: 2017-02-27 — End: 2020-01-18

## 2017-02-27 MED ORDER — SUCRALFATE 1 G PO TABS: 1.0000 g | ORAL_TABLET | Freq: Three times a day (TID) | ORAL | 0 refills | Status: AC

## 2017-02-27 MED ORDER — PANTOPRAZOLE SODIUM 40 MG PO TBEC: 40.0000 mg | DELAYED_RELEASE_TABLET | Freq: Two times a day (BID) | ORAL | 2 refills | Status: AC

## 2017-02-27 MED ORDER — SODIUM CHLORIDE 0.9 % IV SOLN: 100.0000 mL | INTRAVENOUS | Status: DC | PRN

## 2017-02-27 MED ORDER — HEPARIN SODIUM (PORCINE) 1000 UNIT/ML DIALYSIS: 1000.0000 [IU] | INTRAMUSCULAR | Status: DC | PRN

## 2017-02-27 MED ORDER — AMLODIPINE BESYLATE 10 MG PO TABS: 10.0000 mg | ORAL_TABLET | Freq: Every day | ORAL | 0 refills | Status: AC

## 2017-02-27 MED ORDER — LIDOCAINE-PRILOCAINE 2.5-2.5 % EX CREA: 1.0000 "application " | TOPICAL_CREAM | CUTANEOUS | Status: DC | PRN

## 2017-02-27 MED ORDER — ALTEPLASE 2 MG IJ SOLR: 2.0000 mg | Freq: Once | INTRAMUSCULAR | Status: DC | PRN

## 2017-02-27 MED ORDER — PENTAFLUOROPROP-TETRAFLUOROETH EX AERO: 1.0000 "application " | INHALATION_SPRAY | CUTANEOUS | Status: DC | PRN

## 2017-02-27 MED ORDER — LIDOCAINE HCL (PF) 1 % IJ SOLN
5.0000 mL | INTRAMUSCULAR | Status: DC | PRN
Start: 2017-02-27 — End: 2017-02-27

## 2017-02-27 NOTE — Procedures (Signed)
Tolerating HD, no hemodynamic instability.   1Abd pain/ nausea: Improved 2 ESRD:MWF at [facility] [phone]). 3.5hrs, BFR450, EDW 184pounds 3 Hypertension: Improved  4 Nocturnal home O2/CPAP

## 2017-02-27 NOTE — Progress Notes (Signed)
Anthony Davis to be D/C'd Home per MD order.  Discussed prescriptions and follow up appointments with the patient. Prescriptions given to patient, medication list explained in detail. Pt verbalized understanding.  Allergies as of 02/27/2017      Reactions   Ace Inhibitors Other (See Comments)   Other reaction(s): ELEVATED CREATININE   Propofol Other (See Comments)   Unknown reaction - reported by [facility] 09/11/16   Sulfamethoxazole Hives   Ibuprofen Rash   Sulfa Antibiotics Rash   Tetracycline Hives, Swelling, Rash      Medication List    TAKE these medications   acetaminophen 500 MG tablet Commonly known as:  TYLENOL Take 1,000 mg by mouth every 6 (six) hours as needed for mild pain.   amitriptyline 25 MG tablet Commonly known as:  ELAVIL Take 25-50 mg by mouth at bedtime as needed for sleep.   amLODipine 10 MG tablet Commonly known as:  NORVASC Take 1 tablet (10 mg total) by mouth daily.   aspirin 81 MG EC tablet Take 1 tablet (81 mg total) by mouth daily.   calcium carbonate 500 MG chewable tablet Commonly known as:  TUMS - dosed in mg elemental calcium Chew 1 tablet (200 mg of elemental calcium total) by mouth 3 (three) times daily with meals. What changed:    medication strength  how much to take  when to take this   carvedilol 12.5 MG tablet Commonly known as:  COREG Take 12.5 mg by mouth 2 (two) times daily.   cloNIDine 0.2 mg/24hr patch Commonly known as:  CATAPRES - Dosed in mg/24 hr Place 0.2 mg onto the skin every Tuesday.   dicyclomine 20 MG tablet Commonly known as:  BENTYL Take 1 tablet (20 mg total) by mouth 3 (three) times daily as needed for spasms.   esomeprazole 40 MG capsule Commonly known as:  NEXIUM Take 40 mg by mouth 2 (two) times daily before a meal.   lactulose 10 GM/15ML solution Commonly known as:  CHRONULAC Take 20 g by mouth 2 (two) times daily as needed for mild constipation.   levothyroxine 25 MCG tablet Commonly  known as:  SYNTHROID, LEVOTHROID Take 25 mcg by mouth daily before breakfast.   losartan 100 MG tablet Commonly known as:  COZAAR Take 100 mg by mouth at bedtime.   lovastatin 20 MG tablet Commonly known as:  MEVACOR Take 20 mg by mouth daily with supper.   metoCLOPramide 5 MG tablet Commonly known as:  REGLAN Take 5 mg by mouth 2 (two) times daily.   pantoprazole 40 MG tablet Commonly known as:  PROTONIX Take 1 tablet (40 mg total) by mouth 2 (two) times daily.   polyethylene glycol packet Commonly known as:  MIRALAX / GLYCOLAX Take 17 g by mouth daily as needed (constipation). Mix in 8 oz liquid and drink   risperiDONE 1 MG tablet Commonly known as:  RISPERDAL Take 1 mg by mouth 2 (two) times daily. What changed:  Another medication with the same name was changed. Make sure you understand how and when to take each.   risperiDONE 0.5 MG tablet Commonly known as:  RISPERDAL Take 1 tablet (0.5 mg total) by mouth 2 (two) times daily before lunch and supper. What changed:  when to take this   sevelamer carbonate 800 MG tablet Commonly known as:  RENVELA Take 800 mg by mouth 3 (three) times daily with meals.   sucralfate 1 g tablet Commonly known as:  CARAFATE Take 1 tablet (  1 g total) by mouth 4 (four) times daily -  with meals and at bedtime.   Vitamin D (Ergocalciferol) 50000 units Caps capsule Commonly known as:  DRISDOL Take 50,000 Units by mouth once a week. friday       Vitals:   02/27/17 1229 02/27/17 1356  BP: (!) 193/94 (!) 175/63  Pulse: 72 71  Resp: 14 16  Temp: (!) 96.9 F (36.1 C) 97.7 F (36.5 C)  SpO2: 99% 100%    Skin clean, dry and intact without evidence of skin break down, no evidence of skin tears noted. IV catheter discontinued intact. Site without signs and symptoms of complications. Dressing and pressure applied. Pt denies pain at this time. No complaints noted.  An After Visit Summary was printed and given to the patient. Patient  escorted via [place], and D/C home via private auto.  Chuck Hint RN [facility] [employer]

## 2017-02-27 NOTE — Discharge Summary (Signed)
Physician Discharge Summary  Anthony Davis ZGY:[id number] DOB: 1934-11-22 DOA: 02/25/2017  PCP: Anthony Axe, MD  Admit date: 02/25/2017 Discharge date: 02/27/2017  Time spent: 25 minutes  Recommendations for Outpatient Follow-up:  1. Recommend good antireflux measures have added Protonix twice daily should be on Carafate and can use Bentyl 2. Should follow-up as an outpatient for dialysis 3. May need to de-escalate off of Protonix in about 6-8 weeks  Discharge Diagnoses:  Active Problems:   Pulmonary edema   Discharge Condition: Improved  Diet recommendation: Renal heart healthy  Filed Weights   02/26/17 0249 02/26/17 2230 02/27/17 0845  Weight: 84.1 kg (185 lb 6.5 oz) 85 kg (187 lb 6.3 oz) 85.7 kg (188 lb 15 oz)    History of present illness:  82 year old male ESRD MWF [facility] HTN Diabetes mellitus type 2 Obstructive sleep apnea on CPAP-on settings of 2--on chr oxygen for the past 2 years Remote history of hepatitis C-rx HArvoni ~ 06/2016 [name]  Hyperlipidemia  Prior most recent admission 10/2016 acute hypoxic respiratory failure secondary probably to volume overload  States he has had about 6 months of abdominal pain unknown etiology was referred by the emergency room to [facility] who saw the patient in consult I did not recommend any further management and thought best for him to treat constipation aggressively and continue laxative such as lactulose  Returns to the emergency room with constipation and abdominal pain ultrasound 11/8 showed thickened gallbladder-no melena no fever no chills  CT scan showed interval development of moderate to small volume ascites small bilateral effusions and cardiomegaly with right heart enlargement no evidence of any gallbladder pathology  Patient was observed overnight and had no alarm symptoms was able to tolerate a diet and went from a clear liquid diet to a soft diet he was started on Carafate and  Protonix this admission but had already been on Reglan Would continue lactulose and would recommend outpatient follow-up with his primary physician and his gastroenterologist in he will need labs in about a week or so    Procedures:  CT scan abdomen pelvis   Consultations:  Nephrology  Discharge Exam: Vitals:   02/27/17 0845 02/27/17 0858  BP: (!) 146/78 (!) 187/91  Pulse: 66 69  Resp: (!) 24   Temp: (!) 97.5 F (36.4 C)   SpO2: 96%     General: Alert pleasant oriented no distress Cardiovascular: S1-S2 no murmur rub or gallop Respiratory: Clinically clear no added sounds Abdomen soft nontender nondistended no rebound Eating and drinking No new complaints  Discharge Instructions   Discharge Instructions    Diet - low sodium heart healthy   Complete by:  As directed    Discharge instructions   Complete by:  As directed    Please be sure to take new medications that have been prescribed for you specifically take the acid reducer and Carafate and you may need the Bentyl I would recommend that you follow-up with your gastroenterologist as an outpatient   Increase activity slowly   Complete by:  As directed      Current Discharge Medication List    START taking these medications   Details  dicyclomine (BENTYL) 20 MG tablet Take 1 tablet (20 mg total) by mouth 3 (three) times daily as needed for spasms. Qty: 21 tablet, Refills: 0    pantoprazole (PROTONIX) 40 MG tablet Take 1 tablet (40 mg total) by mouth 2 (two) times daily. Qty: 60 tablet, Refills: 2    sucralfate (  CARAFATE) 1 g tablet Take 1 tablet (1 g total) by mouth 4 (four) times daily -  with meals and at bedtime. Qty: 60 tablet, Refills: 0      CONTINUE these medications which have CHANGED   Details  amLODipine (NORVASC) 10 MG tablet Take 1 tablet (10 mg total) by mouth daily. Qty: 30 tablet, Refills: 0    calcium carbonate (TUMS - DOSED IN MG ELEMENTAL CALCIUM) 500 MG chewable tablet Chew 1 tablet  (200 mg of elemental calcium total) by mouth 3 (three) times daily with meals. Qty: 90 tablet, Refills: 0    !! risperiDONE (RISPERDAL) 0.5 MG tablet Take 1 tablet (0.5 mg total) by mouth 2 (two) times daily before lunch and supper. Qty: 20 tablet, Refills: 0     !! - Potential duplicate medications found. Please discuss with provider.    CONTINUE these medications which have NOT CHANGED   Details  acetaminophen (TYLENOL) 500 MG tablet Take 1,000 mg by mouth every 6 (six) hours as needed for mild pain.    amitriptyline (ELAVIL) 25 MG tablet Take 25-50 mg by mouth at bedtime as needed for sleep.  Refills: 2    aspirin EC 81 MG EC tablet Take 1 tablet (81 mg total) by mouth daily. Qty: 30 tablet, Refills: 0    carvedilol (COREG) 12.5 MG tablet Take 12.5 mg by mouth 2 (two) times daily.  Refills: 1    cloNIDine (CATAPRES - DOSED IN MG/24 HR) 0.2 mg/24hr patch Place 0.2 mg onto the skin every Tuesday.     esomeprazole (NEXIUM) 40 MG capsule Take 40 mg by mouth 2 (two) times daily before a meal.     lactulose (CHRONULAC) 10 GM/15ML solution Take 20 g by mouth 2 (two) times daily as needed for mild constipation.    levothyroxine (SYNTHROID, LEVOTHROID) 25 MCG tablet Take 25 mcg by mouth daily before breakfast. Refills: 1    losartan (COZAAR) 100 MG tablet Take 100 mg by mouth at bedtime.     lovastatin (MEVACOR) 20 MG tablet Take 20 mg by mouth daily with supper.     metoCLOPramide (REGLAN) 5 MG tablet Take 5 mg by mouth 2 (two) times daily.    polyethylene glycol (MIRALAX / GLYCOLAX) packet Take 17 g by mouth daily as needed (constipation). Mix in 8 oz liquid and drink    !! risperiDONE (RISPERDAL) 1 MG tablet Take 1 mg by mouth 2 (two) times daily.     sevelamer carbonate (RENVELA) 800 MG tablet Take 800 mg by mouth 3 (three) times daily with meals.     Vitamin D, Ergocalciferol, (DRISDOL) 50000 units CAPS capsule Take 50,000 Units by mouth once a week. friday Refills: 3      !! - Potential duplicate medications found. Please discuss with provider.     Allergies  Allergen Reactions  . Ace Inhibitors Other (See Comments)    Other reaction(s): ELEVATED CREATININE  . Propofol Other (See Comments)    Unknown reaction - reported by [facility] 09/11/16  . Sulfamethoxazole Hives  . Ibuprofen Rash  . Sulfa Antibiotics Rash  . Tetracycline Hives, Swelling and Rash   Follow-up Information    Anthony Axe, MD In 1 week.   Specialty:  Family Medicine Why:  As needed, If symptoms worsen Contact information: [address] [place] [address] [phone]        [facility] EMERGENCY DEPARTMENT.   Specialty:  Emergency Medicine Why:  As needed, If symptoms  worsen Contact information: [address] [id number] [place]       Gastroenterology, [place] In 3 days.   Why:  if unable to see your GI doctor. Contact information: [place] [address] [phone]            The results of significant diagnostics from this hospitalization (including imaging, microbiology, ancillary and laboratory) are listed below for reference.    Significant Diagnostic Studies: Dg Chest 2 View  Result Date: 02/25/2017 CLINICAL DATA:  Chest pain and dyspnea x2 days.  Vomited. EXAM: CHEST  2 VIEW COMPARISON:  10/22/2016, chest CT 09/20/2015 FINDINGS: Stable cardiomegaly with minimal aortic atherosclerosis. Chronic mild elevation of the right hemidiaphragm. Small bilateral pleural effusions with atelectasis noted more so posteriorly. Slightly more confluent opacity projects over the dorsal spine on the lateral view believed to be superimposed effusion and atelectasis. Findings are similar to prior. Pneumonia would be difficult to entirely exclude but believed less likely. No acute osseous appearing abnormality. IMPRESSION: 1. Stable cardiomegaly with minimal aortic  atherosclerosis. 2. Mild interstitial edema with small pleural effusions and atelectasis. More confluent opacity overlying the dorsal spine on the lateral view is likely related to superimposed pleural effusion and atelectasis. Superimposed pneumonia would be difficult to entirely exclude. Clinical correlation is therefore recommended. Electronically Signed   By: Ashley Royalty M.D.   On: 02/25/2017 16:48   Ct Abdomen Pelvis W Contrast  Result Date: 02/25/2017 CLINICAL DATA:  82 y/o  M; worsening lower abdominal pain. EXAM: CT ABDOMEN AND PELVIS WITH CONTRAST TECHNIQUE: Multidetector CT imaging of the abdomen and pelvis was performed using the standard protocol following bolus administration of intravenous contrast. CONTRAST:  190mL ISOVUE-300 IOPAMIDOL (ISOVUE-300) INJECTION 61% COMPARISON:  10/23/2016 CT abdomen and pelvis. FINDINGS: Lower chest: Decreased small right larger than left pleural effusions. Cardiomegaly with prominent right heart enlargement. Small hiatal hernia. Hepatobiliary: No focal liver abnormality is seen. No gallstones or biliary ductal dilatation. Bladder is decompressed. Mild gallbladder wall thickening, likely related to ascites. Pancreas: Unremarkable. No pancreatic ductal dilatation or surrounding inflammatory changes. Spleen: Normal in size without focal abnormality. Adrenals/Urinary Tract: Mild thickening of the adrenal glands without nodule. Atrophic kidneys. Stable left kidney subcentimeter hyperdense cyst. No hydronephrosis. Collapsed bladder. Stomach/Bowel: Stomach is within normal limits. Normal appendix Mild sigmoid diverticulosis. No evidence of bowel wall thickening, distention, or inflammatory changes. Vascular/Lymphatic: Aortic atherosclerosis. No enlarged abdominal or pelvic lymph nodes. Reproductive: Penile prosthesis in the right hemipelvis. Other: Small to moderate volume of ascites and mesenteric edema. Musculoskeletal: No acute or significant osseous findings.  IMPRESSION: 1. Interval development of small to moderate volume of ascites. 2. Decreased small bilateral pleural effusions. 3. Cardiomegaly with prominent right heart enlargement and contrast reflux into liver may represent diastolic failure. 4. Sigmoid diverticulosis. 5. Small hiatal hernia. 6. Mild aortic atherosclerosis. Electronically Signed   By: Kristine Garbe M.D.   On: 02/25/2017 14:58    Microbiology: Recent Results (from the past 240 hour(s))  MRSA PCR Screening     Status: None   Collection Time: 02/25/17  9:05 PM  Result Value Ref Range Status   MRSA by PCR NEGATIVE NEGATIVE Final    Comment:        The GeneXpert MRSA Assay (FDA approved for NASAL specimens only), is one component of a comprehensive MRSA colonization surveillance program. It is not intended to diagnose MRSA infection nor to guide or monitor treatment for MRSA infections.  Labs: Basic Metabolic Panel: Recent Labs  Lab 02/25/17 1239 02/25/17 2208 02/26/17 0443 02/27/17 0513  NA 132*  --  133* 131*  K 4.1  --  3.8 4.4  CL 97*  --  96* 93*  CO2 24  --  26 28  GLUCOSE 142*  --  120* 99  BUN 31*  --  17 23*  CREATININE 8.46* 8.74* 5.56* 7.01*  CALCIUM 7.6*  --  7.8* 7.8*  PHOS  --  3.3  --   --    Liver Function Tests: Recent Labs  Lab 02/25/17 1239 02/26/17 0443 02/27/17 0513  AST 18 18 14*  ALT 12* 12* 8*  ALKPHOS 84 79 72  BILITOT 0.9 0.9 0.6  PROT 6.4* 6.6 6.2*  ALBUMIN 3.1* 3.1* 3.0*   Recent Labs  Lab 02/25/17 1239  LIPASE 24   No results for input(s): AMMONIA in the last 168 hours. CBC: Recent Labs  Lab 02/25/17 1239 02/25/17 2208 02/26/17 0443 02/27/17 0513  WBC 4.6 6.3 6.8 5.1  NEUTROABS  --   --   --  3.9  HGB 11.6* 12.9* 11.8* 11.0*  HCT 35.0* 38.6* 34.9* 33.9*  MCV 94.1 93.7 92.1 94.4  PLT 137* 144* 142* 166   Cardiac Enzymes: No results for input(s): CKTOTAL, CKMB, CKMBINDEX, TROPONINI in the last 168 hours. BNP: BNP (last 3 results) No  results for input(s): BNP in the last 8760 hours.  ProBNP (last 3 results) No results for input(s): PROBNP in the last 8760 hours.  CBG: Recent Labs  Lab 02/25/17 2143 02/26/17 0325  GLUCAP 151* 104*       Signed:  Nita Sells MD   [facility] 02/27/2017, 10:32 AM

## 2017-03-20 ENCOUNTER — Other Ambulatory Visit: Payer: Self-pay | Admitting: Gastroenterology

## 2017-03-20 DIAGNOSIS — R188 Other ascites: Secondary | ICD-10-CM

## 2017-03-29 ENCOUNTER — Ambulatory Visit
Admission: RE | Admit: 2017-03-29 | Discharge: 2017-03-29 | Disposition: A | Discharge status: Home / Self Care | Payer: Medicare Other | Source: Ambulatory Visit | Attending: Gastroenterology | Admitting: Gastroenterology

## 2017-03-29 DIAGNOSIS — R188 Other ascites: Secondary | ICD-10-CM

## 2017-10-27 ENCOUNTER — Emergency Department (HOSPITAL_COMMUNITY)
Admission: EM | Admit: 2017-10-27 | Discharge: 2017-10-27 | Disposition: A | Discharge status: Home / Self Care | Payer: Medicare Other | Attending: Emergency Medicine | Admitting: Emergency Medicine

## 2017-10-27 ENCOUNTER — Emergency Department (HOSPITAL_COMMUNITY): Payer: Medicare Other

## 2017-10-27 ENCOUNTER — Encounter (HOSPITAL_COMMUNITY): Payer: Self-pay | Admitting: Emergency Medicine

## 2017-10-27 DIAGNOSIS — R197 Diarrhea, unspecified: Secondary | ICD-10-CM | POA: Insufficient documentation

## 2017-10-27 DIAGNOSIS — R112 Nausea with vomiting, unspecified: Secondary | ICD-10-CM

## 2017-10-27 DIAGNOSIS — R1084 Generalized abdominal pain: Secondary | ICD-10-CM | POA: Diagnosis present

## 2017-10-27 DIAGNOSIS — I120 Hypertensive chronic kidney disease with stage 5 chronic kidney disease or end stage renal disease: Secondary | ICD-10-CM | POA: Insufficient documentation

## 2017-10-27 DIAGNOSIS — Z87891 Personal history of nicotine dependence: Secondary | ICD-10-CM | POA: Diagnosis not present

## 2017-10-27 DIAGNOSIS — E1122 Type 2 diabetes mellitus with diabetic chronic kidney disease: Secondary | ICD-10-CM | POA: Insufficient documentation

## 2017-10-27 DIAGNOSIS — Z992 Dependence on renal dialysis: Secondary | ICD-10-CM | POA: Diagnosis not present

## 2017-10-27 DIAGNOSIS — J45909 Unspecified asthma, uncomplicated: Secondary | ICD-10-CM | POA: Diagnosis not present

## 2017-10-27 DIAGNOSIS — N186 End stage renal disease: Secondary | ICD-10-CM | POA: Diagnosis not present

## 2017-10-27 LAB — CBC
HCT: 33.5 % — ABNORMAL LOW (ref 39.0–52.0)
HEMOGLOBIN: 10.8 g/dL — AB (ref 13.0–17.0)
MCH: 31.7 pg (ref 26.0–34.0)
MCHC: 32.2 g/dL (ref 30.0–36.0)
MCV: 98.2 fL (ref 78.0–100.0)
PLATELETS: 146 10*3/uL — AB (ref 150–400)
RBC: 3.41 MIL/uL — ABNORMAL LOW (ref 4.22–5.81)
RDW: 15.2 % (ref 11.5–15.5)
WBC: 7.5 10*3/uL (ref 4.0–10.5)

## 2017-10-27 LAB — COMPREHENSIVE METABOLIC PANEL
ALK PHOS: 75 U/L (ref 38–126)
ALT: 23 U/L (ref 0–44)
ANION GAP: 12 (ref 5–15)
AST: 35 U/L (ref 15–41)
Albumin: 3.5 g/dL (ref 3.5–5.0)
BUN: 17 mg/dL (ref 8–23)
CALCIUM: 8 mg/dL — AB (ref 8.9–10.3)
CO2: 27 mmol/L (ref 22–32)
CREATININE: 5.54 mg/dL — AB (ref 0.61–1.24)
Chloride: 98 mmol/L (ref 98–111)
GFR, EST AFRICAN AMERICAN: 10 mL/min — AB (ref >60)
GFR, EST NON AFRICAN AMERICAN: 9 mL/min — AB (ref >60)
Glucose, Bld: 116 mg/dL — ABNORMAL HIGH (ref 70–99)
Potassium: 4.2 mmol/L (ref 3.5–5.1)
SODIUM: 137 mmol/L (ref 135–145)
Total Bilirubin: 0.9 mg/dL (ref 0.3–1.2)
Total Protein: 7 g/dL (ref 6.5–8.1)

## 2017-10-27 LAB — LIPASE, BLOOD: Lipase: 56 U/L — ABNORMAL HIGH (ref 11–51)

## 2017-10-27 MED ORDER — ALUM & MAG HYDROXIDE-SIMETH 200-200-20 MG/5ML PO SUSP
30.0000 mL | Freq: Once | ORAL | Status: AC
  Administered 2017-10-27: 30 mL via ORAL
  Filled 2017-10-27: qty 30

## 2017-10-27 MED ORDER — ONDANSETRON 8 MG PO TBDP: ORAL_TABLET | ORAL | 0 refills | Status: AC

## 2017-10-27 MED ORDER — SUCRALFATE 1 GM/10ML PO SUSP: 1.0000 g | Freq: Three times a day (TID) | ORAL | 0 refills | Status: AC

## 2017-10-27 MED ORDER — DICYCLOMINE HCL 10 MG/ML IM SOLN
20.0000 mg | Freq: Once | INTRAMUSCULAR | Status: AC
  Administered 2017-10-27: 20 mg via INTRAMUSCULAR
  Filled 2017-10-27: qty 2

## 2017-10-27 MED ORDER — GI COCKTAIL ~~LOC~~: 30.0000 mL | Freq: Once | ORAL | Status: DC

## 2017-10-27 NOTE — ED Notes (Signed)
Pt notified a urine sample has been requested. Pt affirmed he would try.

## 2017-10-27 NOTE — ED Notes (Signed)
Taken to Ct at this time.

## 2017-10-27 NOTE — ED Provider Notes (Signed)
[facility] EMERGENCY DEPARTMENT Provider Note   CSN: [id number] Arrival date & time: 10/27/17  0158     History   Chief Complaint Chief Complaint  Patient presents with  . Abdominal Pain  . Nausea    HPI Anthony Davis is a 83 y.o. male.  The history is provided by the patient.  Abdominal Pain   This is a recurrent problem. The current episode started 2 days ago. The problem occurs daily. The problem has not changed since onset.The pain is associated with an unknown factor. The pain is located in the generalized abdominal region. The quality of the pain is cramping. The pain is mild. Associated symptoms include diarrhea, nausea and vomiting. Pertinent negatives include anorexia, fever, belching, flatus, constipation, dysuria, hematuria, headaches, arthralgias and myalgias. Nothing aggravates the symptoms. Nothing relieves the symptoms. Past workup includes GI consult and CT scan. Past workup comments: swallowing study. His past medical history does not include PUD, gallstones or ulcerative colitis.  Has chronic cyclic n/v/d.  No one can tell him why.  2 episodes daily.  Has not taken his home medication for this condition.  No f/c/r.  No urinary symptoms.  No CP, no DOE.  Past Medical History:  Diagnosis Date  . Anemia    low iron  . Arthritis   . Asthma   . Chronic kidney disease    dialysis M/W/F  . Constipation   . Diabetes mellitus without complication ([place])    not on any medications  . GERD (gastroesophageal reflux disease)   . Headache   . Hepatitis    Hepatitis C   . High cholesterol   . Hypertension   . Pulmonary hypertension ([place])   . Shortness of breath dyspnea   . Sleep apnea    Got CPAP about 1 week ago, setting is five    Patient Active Problem List   Diagnosis Date Noted  . Pulmonary edema 02/25/2017  . CHF exacerbation ([place]) 10/23/2016  . ESRD on dialysis ([place]) 10/23/2016  . Diabetes mellitus type 2, diet-controlled ([place])  10/23/2016  . Anemia due to chronic kidney disease 10/23/2016  . Acute on chronic respiratory failure with hypoxia ([place]) 10/23/2016  . OSA on CPAP 10/23/2016  . HCAP (healthcare-associated pneumonia) 10/22/2016    Past Surgical History:  Procedure Laterality Date  . AV FISTULA PLACEMENT    . CATARACT EXTRACTION W/PHACO Left 03/17/2015   Procedure: Cancelled procedure;  Surgeon: Marylynn Pearson, MD;  Location: [facility];  Service: Ophthalmology;  Laterality: Left;  . COLONOSCOPY    . JOINT REPLACEMENT Left    knee  . PENILE PROSTHESIS IMPLANT          Home Medications    Prior to Admission medications   Medication Sig Start Date End Date Taking? Authorizing Provider  albuterol (PROVENTIL HFA;VENTOLIN HFA) 108 (90 Base) MCG/ACT inhaler Inhale 2 puffs into the lungs every 4 (four) hours as needed for wheezing.  10/02/17  Yes [provider]  amitriptyline (ELAVIL) 25 MG tablet Take 25-50 mg by mouth at bedtime as needed for sleep.  12/11/16  Yes [provider]  amLODipine (NORVASC) 10 MG tablet Take 1 tablet (10 mg total) by mouth daily. 02/27/17  Yes Nita Sells, MD  budesonide-formoterol (SYMBICORT) 80-4.5 MCG/ACT inhaler Inhale 2 puffs into the lungs 2 (two) times daily. 05/09/16  Yes [provider]  carvedilol (COREG) 12.5 MG tablet Take 12.5 mg by mouth 2 (two) times daily.  07/30/16  Yes [provider]  cloNIDine (CATAPRES - DOSED IN MG/24 HR) 0.2 mg/24hr patch Place 0.2 mg onto the skin every Tuesday.    Yes [provider]  esomeprazole (NEXIUM) 40 MG capsule Take 40 mg by mouth 2 (two) times daily before a meal.    Yes [provider]  folic acid (FOLVITE) 1 MG tablet Take 1 mg by mouth daily. 10/11/17  Yes [provider]  levothyroxine (SYNTHROID, LEVOTHROID) 25 MCG tablet Take 25 mcg by mouth daily before breakfast. 08/27/16  Yes [provider]  losartan (COZAAR) 100 MG tablet Take 100 mg by mouth at  bedtime.    Yes [provider]  lovastatin (MEVACOR) 20 MG tablet Take 20 mg by mouth daily with supper.    Yes [provider]  metoCLOPramide (REGLAN) 5 MG tablet Take 5 mg by mouth 2 (two) times daily.   Yes [provider]  pantoprazole (PROTONIX) 40 MG tablet Take 1 tablet (40 mg total) by mouth 2 (two) times daily. 02/27/17  Yes Nita Sells, MD  risperiDONE (RISPERDAL) 1 MG tablet Take 1 mg by mouth 2 (two) times daily.    Yes [provider]  sevelamer carbonate (RENVELA) 800 MG tablet Take 800 mg by mouth 3 (three) times daily with meals.    Yes [provider]  sucralfate (CARAFATE) 1 g tablet Take 1 tablet (1 g total) by mouth 4 (four) times daily -  with meals and at bedtime. 02/27/17  Yes Nita Sells, MD  Vitamin D, Ergocalciferol, (DRISDOL) 50000 units CAPS capsule Take 50,000 Units by mouth once a week. friday 01/05/17  Yes [provider]  aspirin EC 81 MG EC tablet Take 1 tablet (81 mg total) by mouth daily. Patient not taking: Reported on 10/27/2017 10/25/16   Cristal Ford, DO  calcium carbonate (TUMS - DOSED IN MG ELEMENTAL CALCIUM) 500 MG chewable tablet Chew 1 tablet (200 mg of elemental calcium total) by mouth 3 (three) times daily with meals. Patient not taking: Reported on 10/27/2017 02/27/17   Nita Sells, MD  dicyclomine (BENTYL) 20 MG tablet Take 1 tablet (20 mg total) by mouth 3 (three) times daily as needed for spasms. Patient not taking: Reported on 10/27/2017 02/25/17   Petrucelli, Aldona Bar R, PA-C  ondansetron (ZOFRAN ODT) 8 MG disintegrating tablet 8mg  ODT q8 hours prn nausea 10/27/17   Shloma Roggenkamp, MD  risperiDONE (RISPERDAL) 0.5 MG tablet Take 1 tablet (0.5 mg total) by mouth 2 (two) times daily before lunch and supper. Patient not taking: Reported on 10/27/2017 02/27/17   Nita Sells, MD  sucralfate (CARAFATE) 1 GM/10ML suspension Take 10 mLs (1 g total) by mouth 4 (four)  times daily -  with meals and at bedtime. 10/27/17   Leela Vanbrocklin, MD    Family History No family history on file.  Social History Social History   Tobacco Use  . Smoking status: Former Smoker    Last attempt to quit: 03/15/1985    Years since quitting: 32.6  . Smokeless tobacco: Never Used  Substance Use Topics  . Alcohol use: No  . Drug use: No     Allergies   Ace inhibitors; Propofol; Sulfamethoxazole; Ibuprofen; Sulfa antibiotics; and Tetracycline   Review of Systems Review of Systems  Constitutional: Negative for diaphoresis and fever.  HENT: Negative for trouble swallowing and voice change.   Respiratory: Negative for shortness of breath.   Cardiovascular: Negative for chest pain, palpitations and leg swelling.  Gastrointestinal: Positive for abdominal pain, diarrhea, nausea and vomiting.  Negative for anorexia, constipation and flatus.  Genitourinary: Negative for dysuria and hematuria.  Musculoskeletal: Negative for arthralgias and myalgias.  Neurological: Negative for headaches.  All other systems reviewed and are negative.    Physical Exam Updated Vital Signs BP (!) 153/66   Pulse (!) 58   Temp 98.6 F (37 C) (Oral)   Resp 18   Ht $R'5\' 11"'Hz$  (1.803 m)   Wt 83 kg (183 lb)   SpO2 97%   BMI 25.52 kg/m   Physical Exam  Constitutional: He is oriented to person, place, and time. He appears well-developed and well-nourished.  HENT:  Head: Normocephalic and atraumatic.  Mouth/Throat: No oropharyngeal exudate.  Eyes: Pupils are equal, round, and reactive to light. Conjunctivae are normal.  Neck: Normal range of motion. Neck supple. No JVD present. No tracheal deviation present.  Cardiovascular: Normal rate, regular rhythm, normal heart sounds and intact distal pulses.  Pulmonary/Chest: Effort normal and breath sounds normal. No stridor. He has no wheezes.  Abdominal: Soft. Bowel sounds are normal. He exhibits no distension and no mass. There is no tenderness.  There is no rebound and no guarding.  Musculoskeletal: Normal range of motion.  Neurological: He is alert and oriented to person, place, and time. He displays normal reflexes. He exhibits normal muscle tone. Coordination normal.  Skin: Skin is warm and dry. Capillary refill takes less than 2 seconds.  Psychiatric: He has a normal mood and affect.  Nursing note and vitals reviewed.    ED Treatments / Results  Labs (all labs ordered are listed, but only abnormal results are displayed) Results for orders placed or performed during the hospital encounter of 10/27/17  Lipase, blood  Result Value Ref Range   Lipase 56 (H) 11 - 51 U/L  Comprehensive metabolic panel  Result Value Ref Range   Sodium 137 135 - 145 mmol/L   Potassium 4.2 3.5 - 5.1 mmol/L   Chloride 98 98 - 111 mmol/L   CO2 27 22 - 32 mmol/L   Glucose, Bld 116 (H) 70 - 99 mg/dL   BUN 17 8 - 23 mg/dL   Creatinine, Ser 5.54 (H) 0.61 - 1.24 mg/dL   Calcium 8.0 (L) 8.9 - 10.3 mg/dL   Total Protein 7.0 6.5 - 8.1 g/dL   Albumin 3.5 3.5 - 5.0 g/dL   AST 35 15 - 41 U/L   ALT 23 0 - 44 U/L   Alkaline Phosphatase 75 38 - 126 U/L   Total Bilirubin 0.9 0.3 - 1.2 mg/dL   GFR calc non Af Amer 9 (L) >60 mL/min   GFR calc Af Amer 10 (L) >60 mL/min   Anion gap 12 5 - 15  CBC  Result Value Ref Range   WBC 7.5 4.0 - 10.5 K/uL   RBC 3.41 (L) 4.22 - 5.81 MIL/uL   Hemoglobin 10.8 (L) 13.0 - 17.0 g/dL   HCT 33.5 (L) 39.0 - 52.0 %   MCV 98.2 78.0 - 100.0 fL   MCH 31.7 26.0 - 34.0 pg   MCHC 32.2 30.0 - 36.0 g/dL   RDW 15.2 11.5 - 15.5 %   Platelets 146 (L) 150 - 400 K/uL   Ct Renal Stone Study  Result Date: 10/27/2017 CLINICAL DATA:  Lower abdominal pain and diarrhea EXAM: CT ABDOMEN AND PELVIS WITHOUT CONTRAST TECHNIQUE: Multidetector CT imaging of the abdomen and pelvis was performed following the standard protocol without IV contrast. COMPARISON:  CT 08/10/2017, 02/25/2017 FINDINGS: Lower chest: Moderate right-sided pleural effusion,  increased compared to prior CT. Consolidation at the left lower lobe and lingula with scattered parenchymal calcifications in the parenchyma. Borderline to mild cardiomegaly. Small pericardial effusion Hepatobiliary: No focal liver abnormality is seen. No gallstones, gallbladder wall thickening, or biliary dilatation. Pancreas: Unremarkable. No pancreatic ductal dilatation or surrounding inflammatory changes. Spleen: Normal in size without focal abnormality. Adrenals/Urinary Tract: Adrenal glands are within normal limits. Atrophic kidneys. No hydronephrosis. 8 mm slightly dense exophytic lesion mid pole left kidney. The bladder is nearly empty Stomach/Bowel: Stomach is within normal limits. Appendix appears normal. No evidence of bowel wall thickening, distention, or inflammatory changes. Sigmoid colon diverticula without acute inflammation. Vascular/Lymphatic: Moderate-to-marked aortic atherosclerosis. No aneurysm. No significantly enlarged lymph nodes Reproductive: Penile prosthesis.  Unremarkable prostate Other: Negative for free air or free fluid. Musculoskeletal: No acute or suspicious bony abnormality. IMPRESSION: 1. No definite CT evidence for acute intra-abdominal or pelvic abnormality. 2. Moderate right pleural effusion, increased compared to prior. Similar consolidation in the right lower lobe with speckled calcification and partial consolidation in the right middle lobe. 3. Atrophic kidneys. Slightly dense 8 mm exophytic lesion mid left kidney stable since 08/10/2017, possible minimal increase in size since 2018. 4. Sigmoid colon diverticular disease without acute inflammatory change. Electronically Signed   By: Donavan Foil M.D.   On: 10/27/2017 03:56    Radiology Ct Renal Stone Study  Result Date: 10/27/2017 CLINICAL DATA:  Lower abdominal pain and diarrhea EXAM: CT ABDOMEN AND PELVIS WITHOUT CONTRAST TECHNIQUE: Multidetector CT imaging of the abdomen and pelvis was performed following the  standard protocol without IV contrast. COMPARISON:  CT 08/10/2017, 02/25/2017 FINDINGS: Lower chest: Moderate right-sided pleural effusion, increased compared to prior CT. Consolidation at the left lower lobe and lingula with scattered parenchymal calcifications in the parenchyma. Borderline to mild cardiomegaly. Small pericardial effusion Hepatobiliary: No focal liver abnormality is seen. No gallstones, gallbladder wall thickening, or biliary dilatation. Pancreas: Unremarkable. No pancreatic ductal dilatation or surrounding inflammatory changes. Spleen: Normal in size without focal abnormality. Adrenals/Urinary Tract: Adrenal glands are within normal limits. Atrophic kidneys. No hydronephrosis. 8 mm slightly dense exophytic lesion mid pole left kidney. The bladder is nearly empty Stomach/Bowel: Stomach is within normal limits. Appendix appears normal. No evidence of bowel wall thickening, distention, or inflammatory changes. Sigmoid colon diverticula without acute inflammation. Vascular/Lymphatic: Moderate-to-marked aortic atherosclerosis. No aneurysm. No significantly enlarged lymph nodes Reproductive: Penile prosthesis.  Unremarkable prostate Other: Negative for free air or free fluid. Musculoskeletal: No acute or suspicious bony abnormality. IMPRESSION: 1. No definite CT evidence for acute intra-abdominal or pelvic abnormality. 2. Moderate right pleural effusion, increased compared to prior. Similar consolidation in the right lower lobe with speckled calcification and partial consolidation in the right middle lobe. 3. Atrophic kidneys. Slightly dense 8 mm exophytic lesion mid left kidney stable since 08/10/2017, possible minimal increase in size since 2018. 4. Sigmoid colon diverticular disease without acute inflammatory change. Electronically Signed   By: Donavan Foil M.D.   On: 10/27/2017 03:56    Procedures Procedures (including critical care time)  Medications Ordered in ED Medications  dicyclomine  (BENTYL) injection 20 mg (20 mg Intramuscular Given 10/27/17 0402)  alum & mag hydroxide-simeth (MAALOX/MYLANTA) 200-200-20 MG/5ML suspension 30 mL (30 mLs Oral Given 10/27/17 0403)    PO challenged in the department  Final Clinical Impressions(s) / ED Diagnoses   Final diagnoses:  Nausea vomiting and diarrhea   Follow up with your PMD and GI specialist.  Take your medication as directed.  Return for pain, numbness, changes in vision or speech, fevers >100.4 unrelieved by medication, shortness of breath, intractable vomiting, or diarrhea, abdominal pain, Inability to tolerate liquids or food, cough, altered mental status or any concerns. No signs of systemic illness or infection. The patient is nontoxic-appearing on exam and vital signs are within normal limits. Will refer to urology for microscopy hematuria as patient is asymptomatic.  I have reviewed the triage vital signs and the nursing notes. Pertinent labs &imaging results that were available during my care of the patient were reviewed by me and considered in my medical decision making (see chart for details).  After history, exam, and medical workup I feel the patient has been appropriately medically screened and is safe for discharge home. Pertinent diagnoses were discussed with the patient. Patient was given return precautions. ED Discharge Orders        Ordered    ondansetron (ZOFRAN ODT) 8 MG disintegrating tablet     10/27/17 0648    sucralfate (CARAFATE) 1 GM/10ML suspension  3 times daily with meals & bedtime     10/27/17 0648       Ramadan Couey, MD 10/27/17 8032

## 2017-10-27 NOTE — ED Triage Notes (Signed)
Brought by ems from home for generalized abdominal pain and n/v.  Reports pain gone at this time but tender on palpation.  Also reports nausea has eased up.  Take omeprazole and zofran at home.  Took omeprazole tonight but has not taken the zofran.  Pt is a dialysis patient.  Goes m/w/f.  Went yesterday.  CBG 132.

## 2018-09-28 IMAGING — CR DG CHEST 2V
2 series · 2 of 2 positions shown · non-contrast
Comparison: 09/11/2016

CLINICAL DATA: Chest pain shortness of breath

EXAM:
CHEST  2 VIEW

[chest pa]
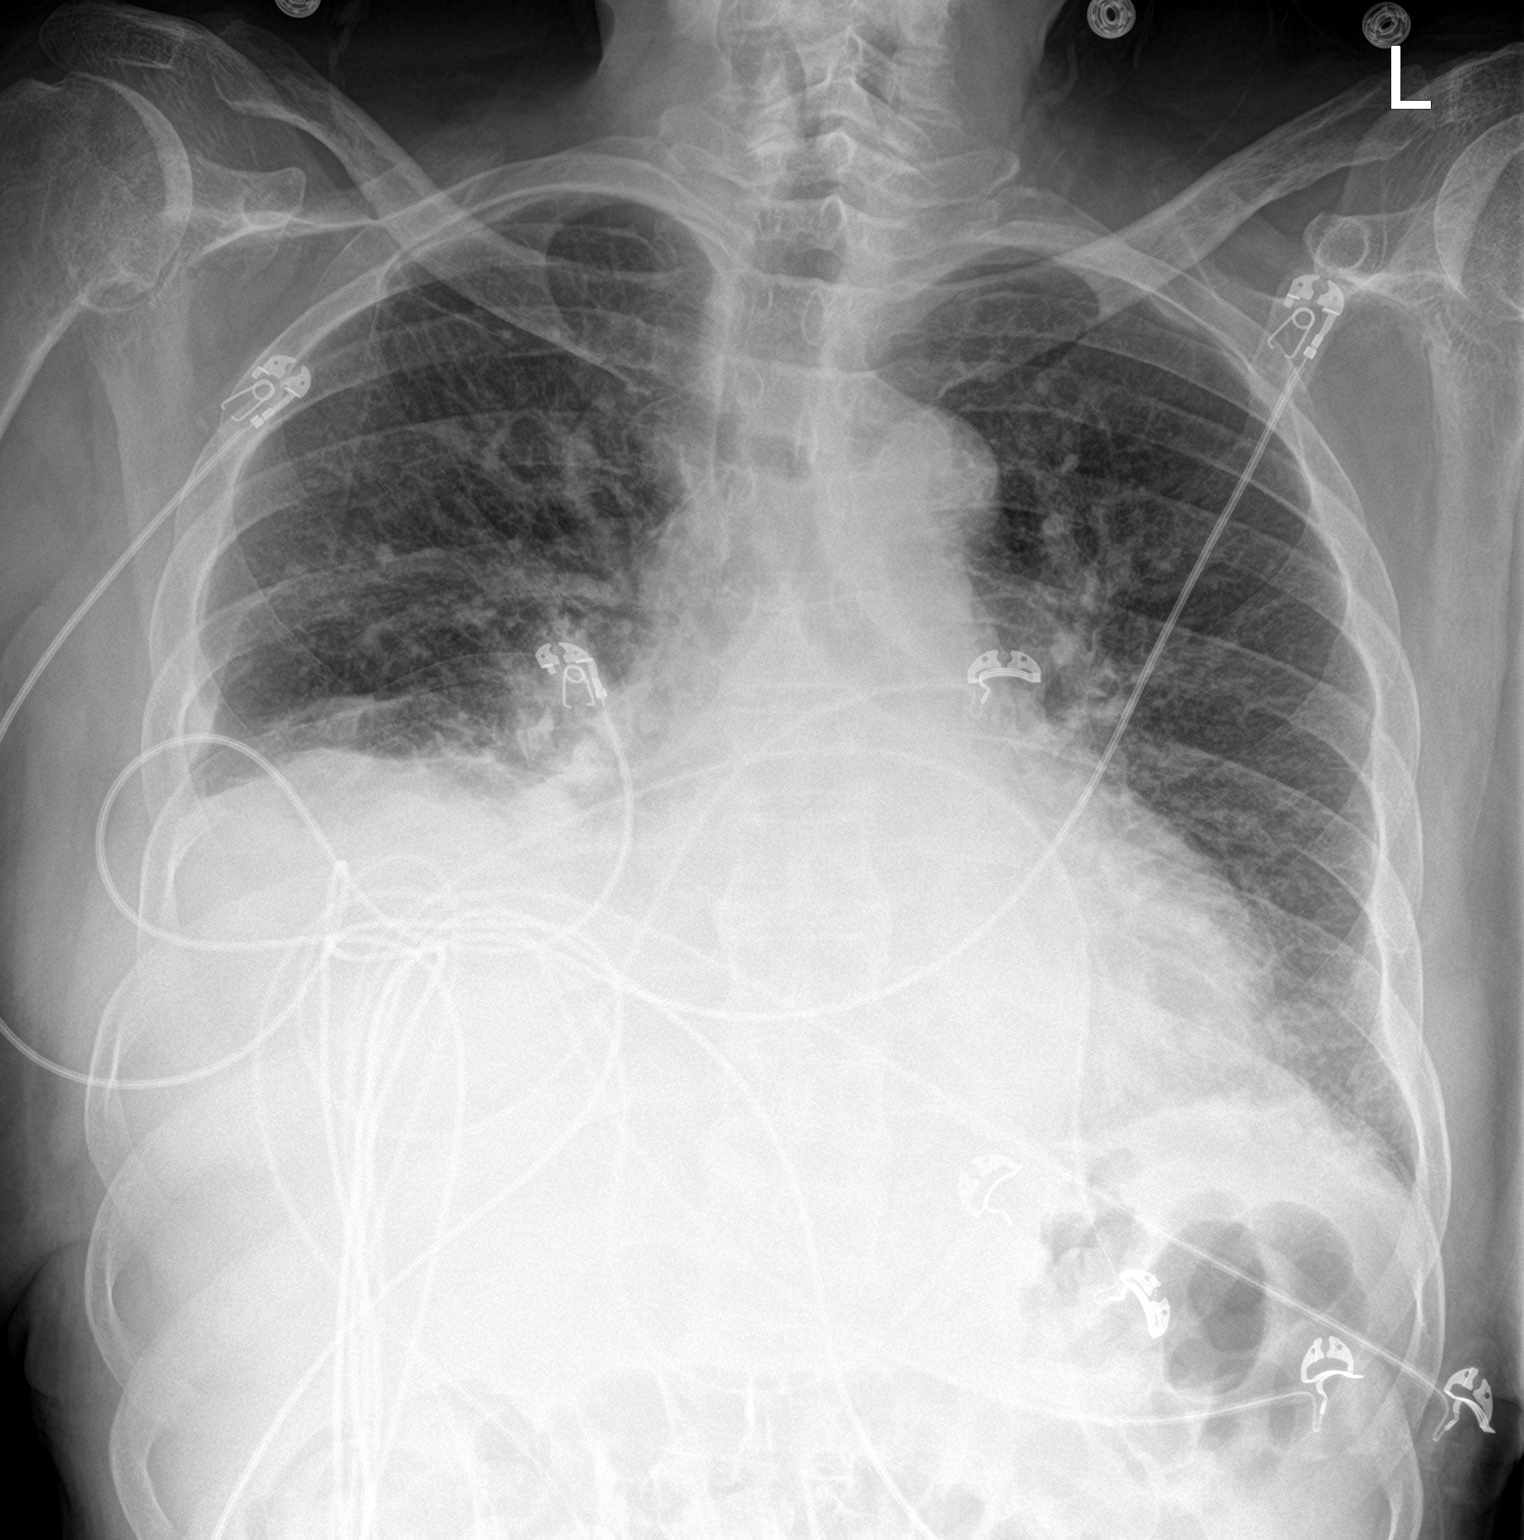

[chest lat]
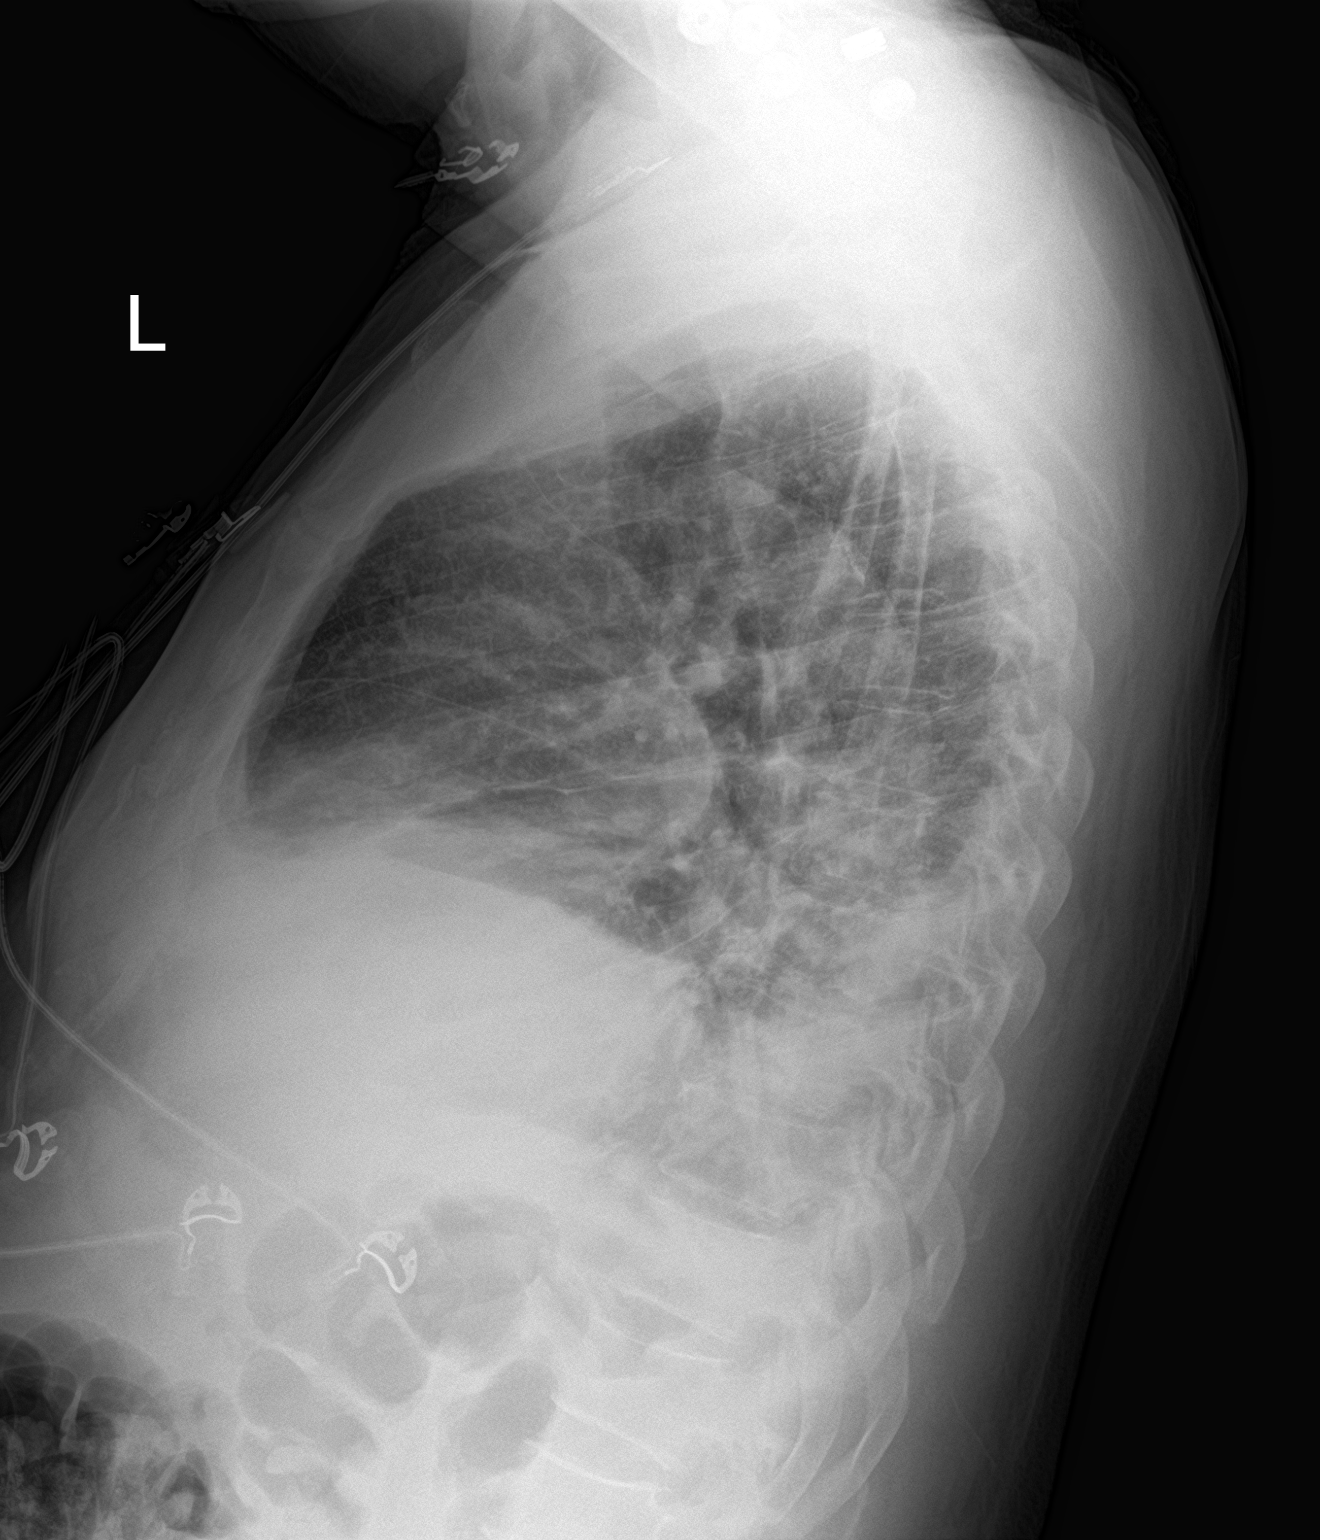

[2 of 2 positions shown; findings below may reference images not displayed]

FINDINGS: Elevation of the right diaphragm. Small bilateral pleural effusions.
Bibasilar atelectasis or mild infiltrates. Borderline cardiomegaly
with mild central congestion. No pneumothorax.
IMPRESSION: 1. Small bilateral pleural effusions with bibasilar atelectasis or
infiltrates
2. Borderline cardiomegaly with central vascular congestion.

## 2018-09-29 IMAGING — CT CT ABD-PELV W/O CM
2 of 4 series · 15 of 46 positions shown, 17 images · non-contrast
Comparison: CT 01/16/2015

CLINICAL DATA: Sudden onset of abdominal pain. Decreased
hemoglobin.

EXAM:
CT ABDOMEN AND PELVIS WITHOUT CONTRAST
TECHNIQUE: Multidetector CT imaging of the abdomen and pelvis was performed
following the standard protocol without IV contrast.

[Series 3: ap without · axial · non-contrast · 0.76mm/px · z∈[+969,+1424]mm · 12 of 103 slices shown, 14 images]
[im 6/103  soft-tissue]
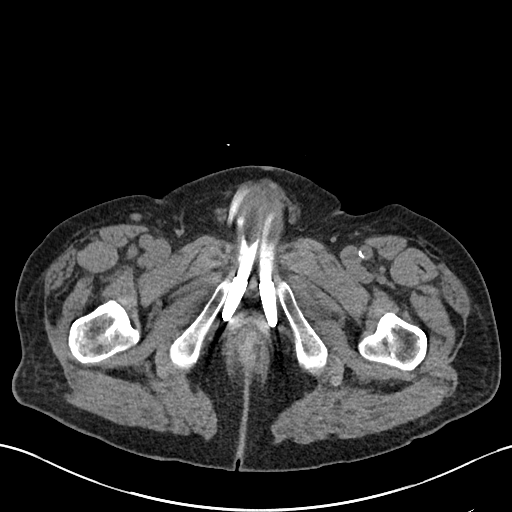
[im 6/103  bone]
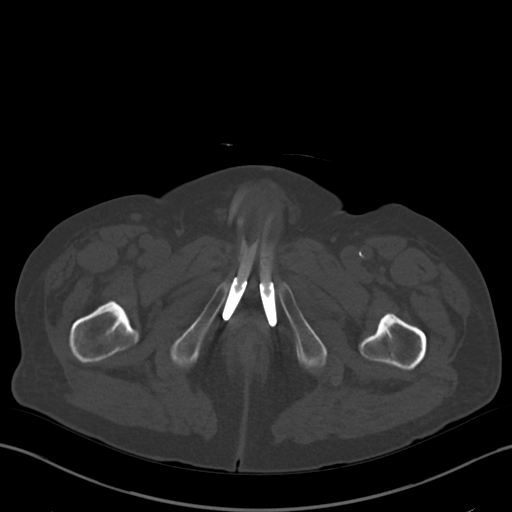
[im 18/103  soft-tissue]
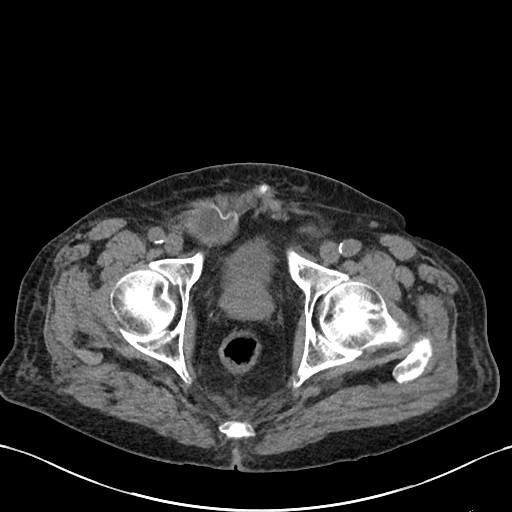
[im 23/103  soft-tissue]
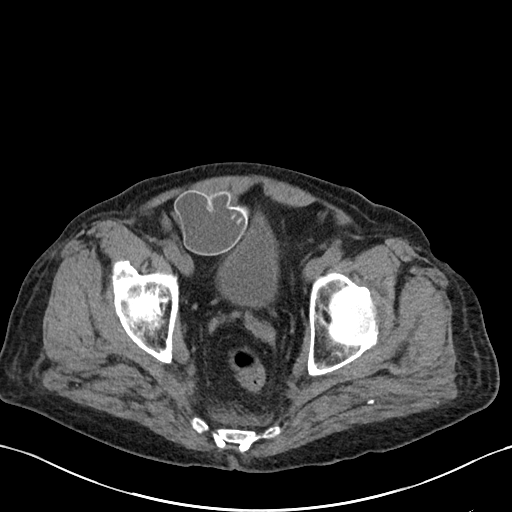
[im 29/103  soft-tissue]
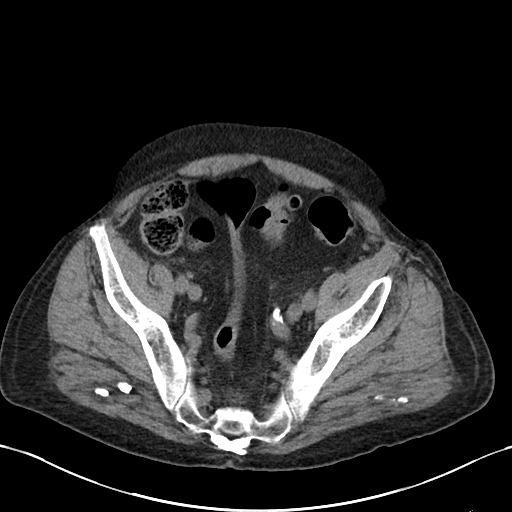
[im 40/103  soft-tissue]
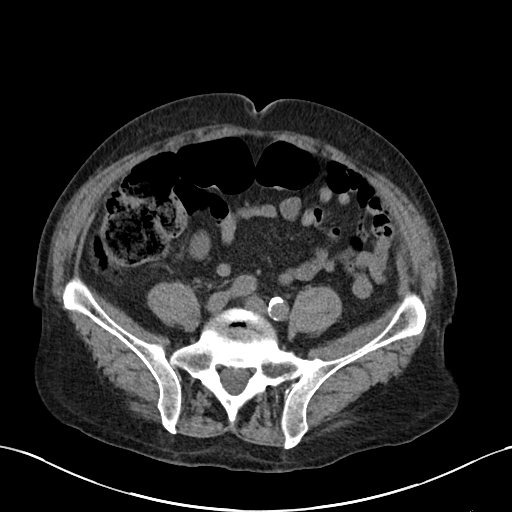
[im 46/103  soft-tissue]
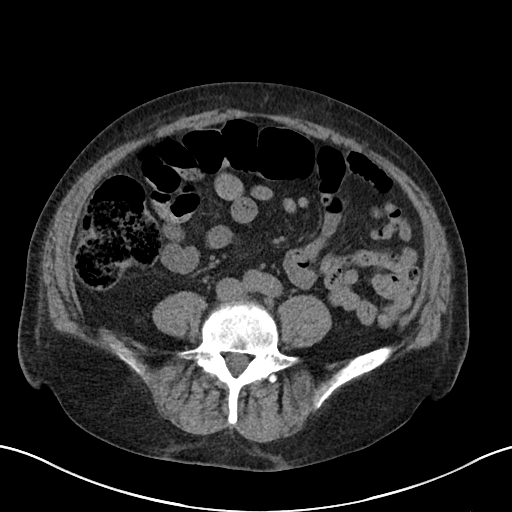
[im 57/103  soft-tissue]
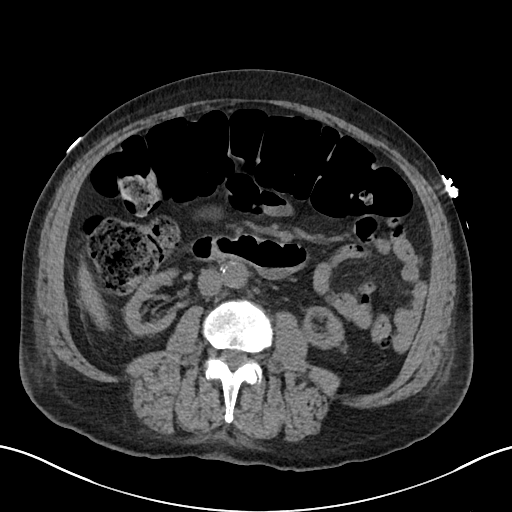
[im 63/103  soft-tissue]
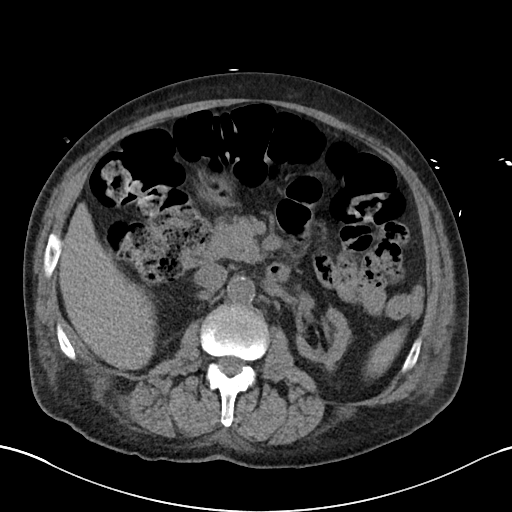
[im 74/103  soft-tissue]
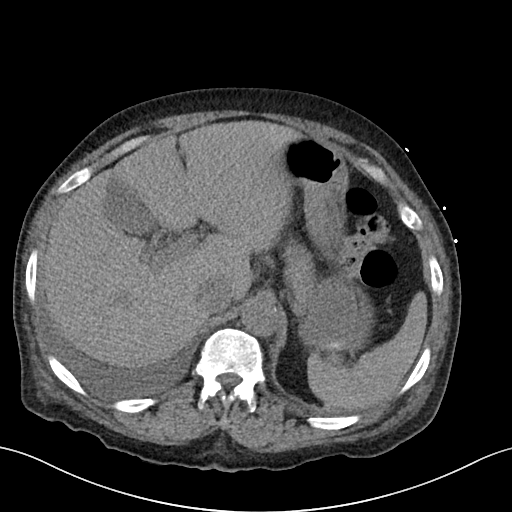
[im 74/103  bone]
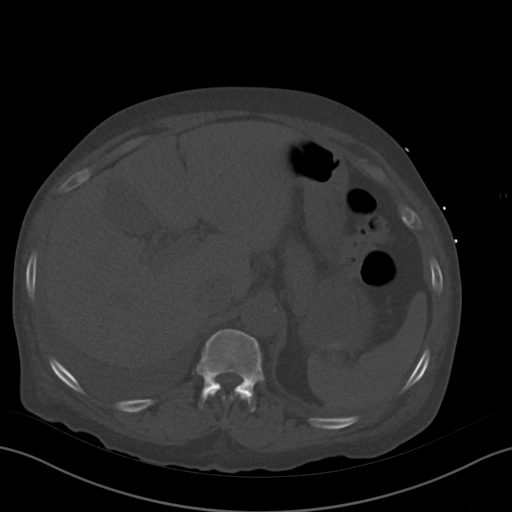
[im 80/103  soft-tissue]
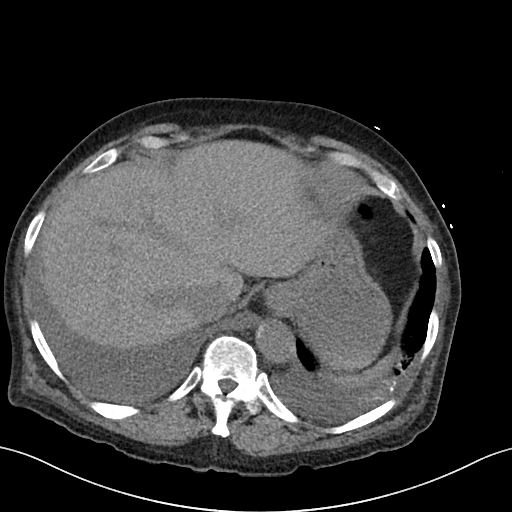
[im 86/103  soft-tissue]
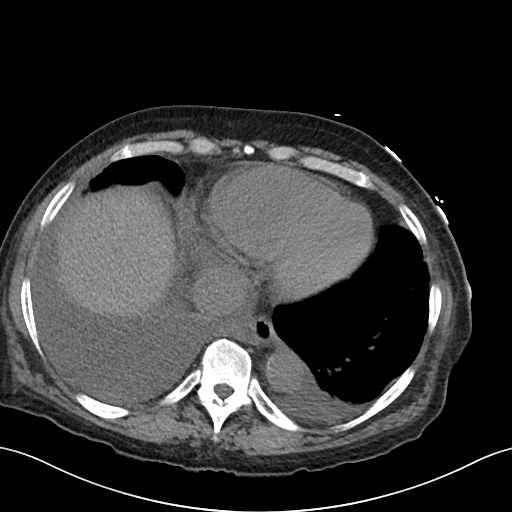
[im 97/103  soft-tissue]
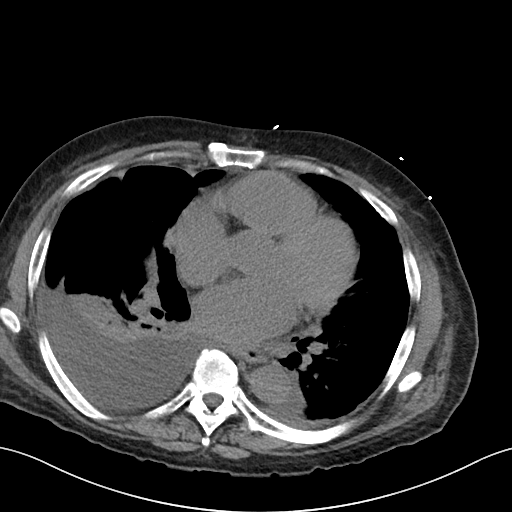

[Series 6: cor · coronal · 0.79mm/px · 3 of 96 slices shown]
[im 32/96  soft-tissue]
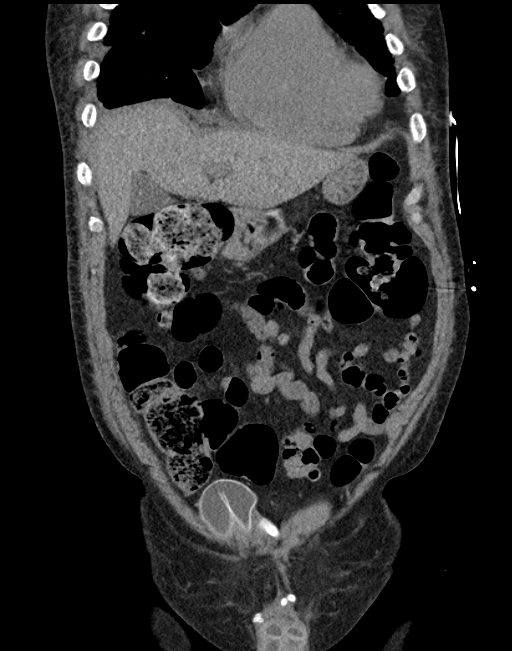
[im 43/96  soft-tissue]
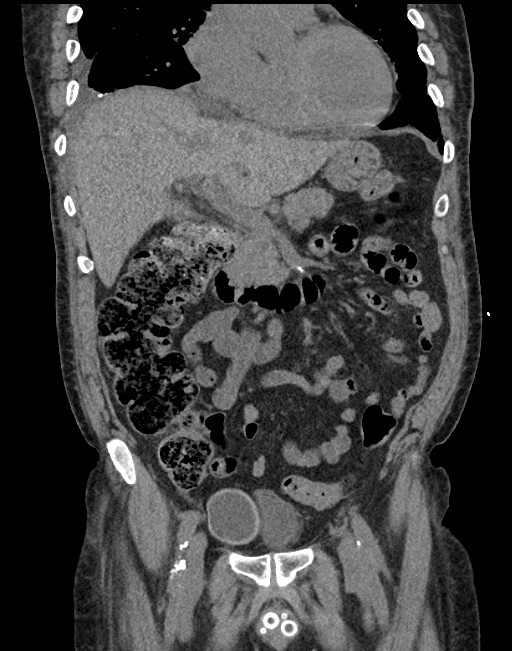
[im 53/96  soft-tissue]
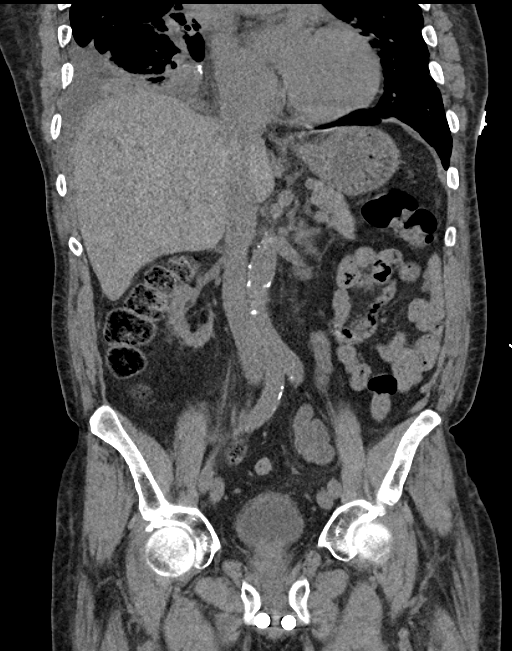

[15 of 46 positions shown; findings below may reference images not displayed]

FINDINGS: Lower chest: Bilateral pleural effusions, moderate on the right and
small on the left. These measure simple fluid density. Adjacent
compressive atelectasis. There multi chamber cardiomegaly with mild
septal thickening. Scattered basilar calcifications.

Hepatobiliary: No evidence for focal lesion allowing for lack
contrast. Gallbladder physiologically distended, no calcified stone.
No biliary dilatation.

Pancreas: No ductal dilatation or inflammation.

Spleen: Normal in size without focal abnormality.

Adrenals/Urinary Tract: Mild thickening on the left adrenal gland
without discrete nodule. No right adrenal nodule. Bilateral renal
cortical thinning. Left renal cyst. Small hyperdense cyst in the mid
left kidney. Urinary bladder is nondistended. Mild bladder wall
thickening.

Stomach/Bowel: Stomach is physiologically distended. No small bowel
dilatation or obstruction. There is mild fecalization of distal
small bowel contents. Moderate to large stool in the cecum and
ascending colon. Mild gaseous distention of transverse colon. Small
volume of stool in the more distal colon. Descending and sigmoid
colonic diverticulosis without acute inflammation. There is sigmoid
colonic redundancy. Normal appendix.

Vascular/Lymphatic: Aortic and branch atherosclerosis. No
pathologically enlarged abdominal or pelvic lymph nodes.

Reproductive: Penile prosthesis in place. Unremarkable prostate
gland.

Other: No free air, free fluid, or intra-abdominal fluid collection.
No evidence of retroperitoneal hemorrhage. Mild presacral edema is
chronic.

Musculoskeletal: There are no acute or suspicious osseous
abnormalities. Bilateral intramuscular gluteal calcifications are
unchanged.
IMPRESSION: 1. No findings to suggest intra-abdominal hemorrhage.
2. Colonic diverticulosis without diverticulitis. Colonic redundancy
with moderate proximal stool burden, can be seen with constipation.
No acute bowel inflammation.
3. Moderate right and small left pleural effusion. Cardiomegaly with
septal thickening, findings consistent with CHF.
4.  Aortic Atherosclerosis ([id number]).

## 2020-01-17 ENCOUNTER — Other Ambulatory Visit: Payer: Self-pay

## 2020-01-17 ENCOUNTER — Emergency Department (HOSPITAL_COMMUNITY)
Admission: EM | Admit: 2020-01-17 | Discharge: 2020-01-18 | Disposition: A | Discharge status: Home / Self Care | Payer: Medicare Other | Attending: Emergency Medicine | Admitting: Emergency Medicine

## 2020-01-17 ENCOUNTER — Emergency Department (HOSPITAL_COMMUNITY): Payer: Medicare Other

## 2020-01-17 ENCOUNTER — Encounter (HOSPITAL_COMMUNITY): Payer: Self-pay | Admitting: Emergency Medicine

## 2020-01-17 DIAGNOSIS — D631 Anemia in chronic kidney disease: Secondary | ICD-10-CM | POA: Diagnosis not present

## 2020-01-17 DIAGNOSIS — Z992 Dependence on renal dialysis: Secondary | ICD-10-CM | POA: Diagnosis not present

## 2020-01-17 DIAGNOSIS — Z7951 Long term (current) use of inhaled steroids: Secondary | ICD-10-CM | POA: Diagnosis not present

## 2020-01-17 DIAGNOSIS — R0789 Other chest pain: Secondary | ICD-10-CM

## 2020-01-17 DIAGNOSIS — Z7982 Long term (current) use of aspirin: Secondary | ICD-10-CM | POA: Insufficient documentation

## 2020-01-17 DIAGNOSIS — Z950 Presence of cardiac pacemaker: Secondary | ICD-10-CM | POA: Diagnosis not present

## 2020-01-17 DIAGNOSIS — Z79899 Other long term (current) drug therapy: Secondary | ICD-10-CM | POA: Insufficient documentation

## 2020-01-17 DIAGNOSIS — Z87891 Personal history of nicotine dependence: Secondary | ICD-10-CM | POA: Diagnosis not present

## 2020-01-17 DIAGNOSIS — I509 Heart failure, unspecified: Secondary | ICD-10-CM | POA: Diagnosis not present

## 2020-01-17 DIAGNOSIS — Z96652 Presence of left artificial knee joint: Secondary | ICD-10-CM | POA: Diagnosis not present

## 2020-01-17 DIAGNOSIS — N186 End stage renal disease: Secondary | ICD-10-CM | POA: Insufficient documentation

## 2020-01-17 DIAGNOSIS — E1122 Type 2 diabetes mellitus with diabetic chronic kidney disease: Secondary | ICD-10-CM | POA: Insufficient documentation

## 2020-01-17 DIAGNOSIS — D539 Nutritional anemia, unspecified: Secondary | ICD-10-CM

## 2020-01-17 DIAGNOSIS — J45909 Unspecified asthma, uncomplicated: Secondary | ICD-10-CM | POA: Insufficient documentation

## 2020-01-17 DIAGNOSIS — I132 Hypertensive heart and chronic kidney disease with heart failure and with stage 5 chronic kidney disease, or end stage renal disease: Secondary | ICD-10-CM | POA: Diagnosis not present

## 2020-01-17 LAB — PROTIME-INR
INR: 1.1 (ref 0.8–1.2)
Prothrombin Time: 13.6 seconds (ref 11.4–15.2)

## 2020-01-17 NOTE — ED Triage Notes (Signed)
Patient reports central chest pain with SOB and emesis today , denies cough or fever .

## 2020-01-18 ENCOUNTER — Emergency Department (HOSPITAL_COMMUNITY): Payer: Medicare Other

## 2020-01-18 DIAGNOSIS — R0789 Other chest pain: Secondary | ICD-10-CM | POA: Diagnosis not present

## 2020-01-18 LAB — BASIC METABOLIC PANEL
Anion gap: 15 (ref 5–15)
BUN: 44 mg/dL — ABNORMAL HIGH (ref 8–23)
CO2: 23 mmol/L (ref 22–32)
Calcium: 9.3 mg/dL (ref 8.9–10.3)
Chloride: 96 mmol/L — ABNORMAL LOW (ref 98–111)
Creatinine, Ser: 9.16 mg/dL — ABNORMAL HIGH (ref 0.61–1.24)
GFR, Estimated: 5 mL/min — ABNORMAL LOW (ref >60)
Glucose, Bld: 100 mg/dL — ABNORMAL HIGH (ref 70–99)
Potassium: 5 mmol/L (ref 3.5–5.1)
Sodium: 134 mmol/L — ABNORMAL LOW (ref 135–145)

## 2020-01-18 LAB — CBC
HCT: 34.7 % — ABNORMAL LOW (ref 39.0–52.0)
Hemoglobin: 11.1 g/dL — ABNORMAL LOW (ref 13.0–17.0)
MCH: 32.5 pg (ref 26.0–34.0)
MCHC: 32 g/dL (ref 30.0–36.0)
MCV: 101.5 fL — ABNORMAL HIGH (ref 80.0–100.0)
Platelets: 156 10*3/uL (ref 150–400)
RBC: 3.42 MIL/uL — ABNORMAL LOW (ref 4.22–5.81)
RDW: 13.4 % (ref 11.5–15.5)
WBC: 7.7 10*3/uL (ref 4.0–10.5)
nRBC: 0 % (ref 0.0–0.2)

## 2020-01-18 LAB — TROPONIN I (HIGH SENSITIVITY)
Troponin I (High Sensitivity): 20 ng/L — ABNORMAL HIGH (ref <18)
Troponin I (High Sensitivity): 23 ng/L — ABNORMAL HIGH (ref <18)

## 2020-01-18 MED ORDER — IOHEXOL 350 MG/ML SOLN
75.0000 mL | Freq: Once | INTRAVENOUS | Status: AC | PRN
  Administered 2020-01-18: 75 mL via INTRAVENOUS

## 2020-01-18 MED ORDER — ACETAMINOPHEN 325 MG PO TABS
650.0000 mg | ORAL_TABLET | Freq: Once | ORAL | Status: AC
  Administered 2020-01-18: 650 mg via ORAL
  Filled 2020-01-18: qty 2

## 2020-01-18 NOTE — ED Notes (Addendum)
Pt states he began to have right sided chest pain over the site of his pacemaker yesterday and describes it has a soreness in his chest and worse with inspiration. Pt is also a MWF dialysis pt with a port in left upper chest. Currently using the port in chest for dialysis due to fistula in left upper arm clotted this week. Pt is alert and oriented. Pt helped into bed placed and placed on cardiac monitor

## 2020-01-18 NOTE — Discharge Instructions (Signed)
Apply ice for thirty minutes at a time, four times a day.  Take acetaminophen (Tylenol) as needed for pain.  Return if you start running a fever, pain is getting worse, or the area around your pacemaker tarts getting red and swollen.

## 2020-01-18 NOTE — ED Provider Notes (Signed)
[facility] EMERGENCY DEPARTMENT Provider Note   CSN: [id number] Arrival date & time: 01/17/20  2307   History Chief Complaint  Patient presents with   Chest Pain    Anthony Davis is a 85 y.o. male.  The history is provided by the patient.  Chest Pain He has history of hypertension, diabetes, hyperlipidemia, pulmonary hypertension, end-stage renal disease on hemodialysis with last dialysis session 10/15 and comes in with onset on 10/16 of pain in the right side of the chest.  Pain is dull but severe and he rates it at 10/10.  Is worse with movement and worse with taking a deep breath.  He feels like it is around his pacemaker.  Pacemaker was inserted about 1 year ago.  He does admit to dyspnea, nausea, diaphoresis.  He denies cough or fever.  He has had some chills.  He denies any trauma.  He has taken acetaminophen which is given slight, temporary relief.  Past Medical History:  Diagnosis Date   Anemia    low iron   Arthritis    Asthma    Chronic Davis disease    dialysis M/W/F   Constipation    Diabetes mellitus without complication (HCC)    not on any medications   GERD (gastroesophageal reflux disease)    Headache    Hepatitis    Hepatitis C    High cholesterol    Hypertension    Pulmonary hypertension (HCC)    Shortness of breath dyspnea    Sleep apnea    Got CPAP about 1 week ago, setting is five    Patient Active Problem List   Diagnosis Date Noted   Pulmonary edema 02/25/2017   CHF exacerbation ([place]) 10/23/2016   ESRD on dialysis ([place]) 10/23/2016   Diabetes mellitus type 2, diet-controlled ([place]) 10/23/2016   Anemia due to chronic Davis disease 10/23/2016   Acute on chronic respiratory failure with hypoxia ([place]) 10/23/2016   OSA on CPAP 10/23/2016   HCAP (healthcare-associated pneumonia) 10/22/2016    Past Surgical History:  Procedure Laterality Date   AV FISTULA PLACEMENT     CATARACT EXTRACTION W/PHACO  Left 03/17/2015   Procedure: Cancelled procedure;  Surgeon: Marylynn Pearson, MD;  Location: [place];  Service: Ophthalmology;  Laterality: Left;   COLONOSCOPY     JOINT REPLACEMENT Left    knee   PENILE PROSTHESIS IMPLANT         No family history on file.  Social History   Tobacco Use   Smoking status: Former Smoker    Quit date: 03/15/1985    Years since quitting: 34.8   Smokeless tobacco: Never Used  Substance Use Topics   Alcohol use: No   Drug use: No    Home Medications Prior to Admission medications   Medication Sig Start Date End Date Taking? Authorizing Provider  albuterol (PROVENTIL HFA;VENTOLIN HFA) 108 (90 Base) MCG/ACT inhaler Inhale 2 puffs into the lungs every 4 (four) hours as needed for wheezing.  10/02/17   [provider]  amitriptyline (ELAVIL) 25 MG tablet Take 25-50 mg by mouth at bedtime as needed for sleep.  12/11/16   [provider]  amLODipine (NORVASC) 10 MG tablet Take 1 tablet (10 mg total) by mouth daily. 02/27/17   Nita Sells, MD  aspirin EC 81 MG EC tablet Take 1 tablet (81 mg total) by mouth daily. Patient not taking: Reported on 10/27/2017 10/25/16   Cristal Ford, DO  budesonide-formoterol [facility]) 80-4.5 MCG/ACT inhaler Inhale 2  puffs into the lungs 2 (two) times daily. 05/09/16   [provider]  calcium carbonate (TUMS - DOSED IN MG ELEMENTAL CALCIUM) 500 MG chewable tablet Chew 1 tablet (200 mg of elemental calcium total) by mouth 3 (three) times daily with meals. Patient not taking: Reported on 10/27/2017 02/27/17   Nita Sells, MD  carvedilol (COREG) 12.5 MG tablet Take 12.5 mg by mouth 2 (two) times daily.  07/30/16   [provider]  cloNIDine (CATAPRES - DOSED IN MG/24 HR) 0.2 mg/24hr patch Place 0.2 mg onto the skin every Tuesday.     [provider]  dicyclomine (BENTYL) 20 MG tablet Take 1 tablet (20 mg total) by mouth 3 (three) times daily as needed for spasms. Patient  not taking: Reported on 10/27/2017 02/25/17   Petrucelli, Glynda Jaeger, PA-C  esomeprazole (NEXIUM) 40 MG capsule Take 40 mg by mouth 2 (two) times daily before a meal.     [provider]  folic acid (FOLVITE) 1 MG tablet Take 1 mg by mouth daily. 10/11/17   [provider]  levothyroxine (SYNTHROID, LEVOTHROID) 25 MCG tablet Take 25 mcg by mouth daily before breakfast. 08/27/16   [provider]  losartan (COZAAR) 100 MG tablet Take 100 mg by mouth at bedtime.     [provider]  lovastatin (MEVACOR) 20 MG tablet Take 20 mg by mouth daily with supper.     [provider]  metoCLOPramide (REGLAN) 5 MG tablet Take 5 mg by mouth 2 (two) times daily.    [provider]  ondansetron (ZOFRAN ODT) 8 MG disintegrating tablet 8mg  ODT q8 hours prn nausea 10/27/17   Palumbo, April, MD  pantoprazole (PROTONIX) 40 MG tablet Take 1 tablet (40 mg total) by mouth 2 (two) times daily. 02/27/17   Nita Sells, MD  risperiDONE (RISPERDAL) 0.5 MG tablet Take 1 tablet (0.5 mg total) by mouth 2 (two) times daily before lunch and supper. Patient not taking: Reported on 10/27/2017 02/27/17   Nita Sells, MD  risperiDONE (RISPERDAL) 1 MG tablet Take 1 mg by mouth 2 (two) times daily.     [provider]  sevelamer carbonate (RENVELA) 800 MG tablet Take 800 mg by mouth 3 (three) times daily with meals.     [provider]  sucralfate (CARAFATE) 1 g tablet Take 1 tablet (1 g total) by mouth 4 (four) times daily -  with meals and at bedtime. 02/27/17   Nita Sells, MD  sucralfate (CARAFATE) 1 GM/10ML suspension Take 10 mLs (1 g total) by mouth 4 (four) times daily -  with meals and at bedtime. 10/27/17   Palumbo, April, MD  Vitamin D, Ergocalciferol, (DRISDOL) 50000 units CAPS capsule Take 50,000 Units by mouth once a week. friday 01/05/17   [provider]    Allergies    Ace inhibitors, Propofol, Sulfamethoxazole,  Ibuprofen, Sulfa antibiotics, and Tetracycline  Review of Systems   Review of Systems  Cardiovascular: Positive for chest pain.  All other systems reviewed and are negative.   Physical Exam Updated Vital Signs BP 116/64    Pulse 80    Temp 97.9 F (36.6 C) (Oral)    Resp 18    Ht 5\' 11"  (1.803 m)    Wt 90 kg    SpO2 95%    BMI 27.67 kg/m   Physical Exam Vitals and nursing note reviewed.   85 year old male, resting comfortably and in no acute distress. Vital signs are normal. Oxygen saturation  is 95%, which is normal. Head is normocephalic and atraumatic. PERRLA, EOMI. Oropharynx is clear. Neck is nontender and supple without adenopathy or JVD. Back is nontender and there is no CVA tenderness. Lungs are clear without rales, wheezes, or rhonchi. Chest: Dialysis access catheters present on the left.  Pacemaker is present in the right subclavian area with mild tenderness around the pacemaker pocket.  There is no erythema, warmth, swelling, fluctuance. Heart has regular rate and rhythm with 0-2/6 holosystolic murmur. Abdomen is soft, flat, nontender without masses or hepatosplenomegaly and peristalsis is normoactive. Extremities have no cyanosis or edema, full range of motion is present. Skin is warm and dry without rash. Neurologic: Mental status is normal, cranial nerves are intact, there are no motor or sensory deficits.  ED Results / Procedures / Treatments   Labs (all labs ordered are listed, but only abnormal results are displayed) Labs Reviewed  BASIC METABOLIC PANEL - Abnormal; Notable for the following components:      Result Value   Sodium 134 (*)    Chloride 96 (*)    Glucose, Bld 100 (*)    BUN 44 (*)    Creatinine, Ser 9.16 (*)    GFR, Estimated 5 (*)    All other components within normal limits  CBC - Abnormal; Notable for the following components:   RBC 3.42 (*)    Hemoglobin 11.1 (*)    HCT 34.7 (*)    MCV 101.5 (*)    All other components within normal  limits  TROPONIN I (HIGH SENSITIVITY) - Abnormal; Notable for the following components:   Troponin I (High Sensitivity) 20 (*)    All other components within normal limits  TROPONIN I (HIGH SENSITIVITY) - Abnormal; Notable for the following components:   Troponin I (High Sensitivity) 23 (*)    All other components within normal limits  PROTIME-INR    EKG EKG Interpretation  Date/Time:  Saturday January 17 2020 23:19:44 EDT Ventricular Rate:  69 PR Interval:  230 QRS Duration: 84 QT Interval:  424 QTC Calculation: 454 R Axis:   -18 Text Interpretation: Atrial-paced rhythm with prolonged AV conduction Nonspecific ST abnormality Abnormal ECG When compared with ECG of 10/27/2017, ATRIAL PACED RHYTHM has replaced Sinus rhythm Confirmed by Delora Fuel ([id number]) on 01/18/2020 1:56:26 AM   Radiology DG Chest 2 View  Result Date: 01/17/2020 CLINICAL DATA:  Chest pain EXAM: CHEST - 2 VIEW COMPARISON:  12/18/2018 FINDINGS: Unchanged position of right chest wall pacemaker leads. Left IJ approach central venous catheter tip is at the cavoatrial junction. Small right pleural effusion with basilar atelectasis or consolidation. Left lung is clear. IMPRESSION: Small right pleural effusion with basilar atelectasis or consolidation. Electronically Signed   By: Ulyses Jarred M.D.   On: 01/17/2020 23:56   CT Angio Chest PE W and/or Wo Contrast  Result Date: 01/18/2020 CLINICAL DATA:  Chest pain EXAM: CT ANGIOGRAPHY CHEST WITH CONTRAST TECHNIQUE: Multidetector CT imaging of the chest was performed using the standard protocol during bolus administration of intravenous contrast. Multiplanar CT image reconstructions and MIPs were obtained to evaluate the vascular anatomy. CONTRAST:  26mL OMNIPAQUE IOHEXOL 350 MG/ML SOLN COMPARISON:  None. FINDINGS: Cardiovascular: Contrast injection is sufficient to demonstrate satisfactory opacification of the pulmonary arteries to the segmental level. There is no pulmonary  embolus or evidence of right heart strain. The size of the main pulmonary artery is normal. Heart size is normal, with no pericardial effusion. There is an aberrant right subclavian artery. There is  calcific aortic atherosclerosis. Opacification of the aorta is diminished due to preferential timing for the pulmonary arteries. Mediastinum/Nodes: No mediastinal, hilar or axillary lymphadenopathy. Normal visualized thyroid. Thoracic esophageal course is normal. Lungs/Pleura: Small right pleural effusion with basilar atelectasis or consolidation. Upper Abdomen: Contrast bolus timing is not optimized for evaluation of the abdominal organs. The visualized portions of the organs of the upper abdomen are normal. Musculoskeletal: No chest wall abnormality. No bony spinal canal stenosis. Review of the MIP images confirms the above findings. IMPRESSION: 1. No pulmonary embolus or acute aortic syndrome. 2. Small right pleural effusion with basilar atelectasis or consolidation. Aortic Atherosclerosis (ICD10-I70.0). Electronically Signed   By: Ulyses Jarred M.D.   On: 01/18/2020 05:25    Procedures Procedures (including critical care time)  Medications Ordered in ED Medications - No data to display  ED Course  I have reviewed the triage vital signs and the nursing notes.  Pertinent labs & imaging results that were available during my care of the patient were reviewed by me and considered in my medical decision making (see chart for details).  MDM Rules/Calculators/A&P Chest pain of uncertain cause.  Presentation is atypical for ACS.  Pain worsening with movement suggest musculoskeletal origin, need to consider possibility of pulmonary embolism.  This differential does include conditions with significant morbidity and mortality.  ECG shows no acute process, normal function of atrial pacemaker.  Chest x-ray shows no acute process.  Troponin is mildly elevated but not changing with time suggesting that it is secondary  to his chronic renal disease.  Will send for CT angiogram to rule out pulmonary embolism.  Old records are reviewed, and he has no relevant past visits.2  CT angiogram shows no evidence of pulmonary embolism. Also, no evidence of occult pneumonia, no soft tissue stranding to suggest inflammation or infection. Patient is advised of this finding. He is discharged with instructions to apply ice to the sore area, continue taking acetaminophen as needed for pain. Return precautions discussed.  Final Clinical Impression(s) / ED Diagnoses Final diagnoses:  Chest wall pain  End-stage renal disease on hemodialysis ([place])  Macrocytic anemia    Rx / DC Orders ED Discharge Orders    None       Delora Fuel, MD 93/23/55 [phone]

## 2020-06-01 DEATH — deceased
